# Patient Record
Sex: Female | Born: 1975 | Race: Black or African American | Hispanic: No | Marital: Single | Smoking: Never smoker
Health system: Southern US, Community
[De-identification: ages and names within clinical notes are randomized; demographics above are authoritative.]

## PROBLEM LIST (undated history)

## (undated) DIAGNOSIS — F32A Depression, unspecified: Secondary | ICD-10-CM

## (undated) DIAGNOSIS — L309 Dermatitis, unspecified: Secondary | ICD-10-CM

## (undated) DIAGNOSIS — T7840XA Allergy, unspecified, initial encounter: Secondary | ICD-10-CM

## (undated) DIAGNOSIS — F329 Major depressive disorder, single episode, unspecified: Secondary | ICD-10-CM

## (undated) HISTORY — DX: Dermatitis, unspecified: L30.9

## (undated) HISTORY — DX: Major depressive disorder, single episode, unspecified: F32.9

## (undated) HISTORY — PX: CYST EXCISION: SHX5701

## (undated) HISTORY — DX: Depression, unspecified: F32.A

## (undated) HISTORY — DX: Allergy, unspecified, initial encounter: T78.40XA

---

## 1998-11-04 ENCOUNTER — Ambulatory Visit (HOSPITAL_COMMUNITY): Admission: RE | Admit: 1998-11-04 | Discharge: 1998-11-04 | Payer: Self-pay | Admitting: *Deleted

## 1999-05-13 ENCOUNTER — Other Ambulatory Visit: Admission: RE | Admit: 1999-05-13 | Discharge: 1999-05-13 | Payer: Self-pay | Admitting: *Deleted

## 1999-06-21 ENCOUNTER — Other Ambulatory Visit: Admission: RE | Admit: 1999-06-21 | Discharge: 1999-06-21 | Payer: Self-pay | Admitting: Obstetrics and Gynecology

## 1999-06-21 ENCOUNTER — Encounter (INDEPENDENT_AMBULATORY_CARE_PROVIDER_SITE_OTHER): Payer: Self-pay | Admitting: Specialist

## 1999-10-20 ENCOUNTER — Other Ambulatory Visit: Admission: RE | Admit: 1999-10-20 | Discharge: 1999-10-20 | Payer: Self-pay | Admitting: Obstetrics and Gynecology

## 2000-04-20 ENCOUNTER — Other Ambulatory Visit: Admission: RE | Admit: 2000-04-20 | Discharge: 2000-04-20 | Payer: Self-pay | Admitting: *Deleted

## 2000-10-23 ENCOUNTER — Other Ambulatory Visit: Admission: RE | Admit: 2000-10-23 | Discharge: 2000-10-23 | Payer: Self-pay | Admitting: Obstetrics and Gynecology

## 2001-05-16 ENCOUNTER — Other Ambulatory Visit: Admission: RE | Admit: 2001-05-16 | Discharge: 2001-05-16 | Payer: Self-pay | Admitting: *Deleted

## 2001-05-17 ENCOUNTER — Other Ambulatory Visit: Admission: RE | Admit: 2001-05-17 | Discharge: 2001-05-17 | Payer: Self-pay | Admitting: *Deleted

## 2002-01-03 ENCOUNTER — Other Ambulatory Visit: Admission: RE | Admit: 2002-01-03 | Discharge: 2002-01-03 | Payer: Self-pay | Admitting: Obstetrics and Gynecology

## 2003-11-27 ENCOUNTER — Other Ambulatory Visit: Admission: RE | Admit: 2003-11-27 | Discharge: 2003-11-27 | Payer: Self-pay | Admitting: Family Medicine

## 2005-02-10 ENCOUNTER — Other Ambulatory Visit: Admission: RE | Admit: 2005-02-10 | Discharge: 2005-02-10 | Payer: Self-pay | Admitting: Family Medicine

## 2005-05-04 ENCOUNTER — Ambulatory Visit (HOSPITAL_COMMUNITY): Admission: RE | Admit: 2005-05-04 | Discharge: 2005-05-04 | Payer: Self-pay | Admitting: Orthopedic Surgery

## 2005-05-31 ENCOUNTER — Encounter: Admission: RE | Admit: 2005-05-31 | Discharge: 2005-05-31 | Payer: Self-pay | Admitting: Orthopedic Surgery

## 2005-12-08 ENCOUNTER — Encounter: Admission: RE | Admit: 2005-12-08 | Discharge: 2005-12-08 | Payer: Self-pay | Admitting: Emergency Medicine

## 2006-03-09 ENCOUNTER — Other Ambulatory Visit: Admission: RE | Admit: 2006-03-09 | Discharge: 2006-03-09 | Payer: Self-pay | Admitting: Family Medicine

## 2007-03-12 ENCOUNTER — Other Ambulatory Visit: Admission: RE | Admit: 2007-03-12 | Discharge: 2007-03-12 | Payer: Self-pay | Admitting: Family Medicine

## 2008-03-18 ENCOUNTER — Other Ambulatory Visit: Admission: RE | Admit: 2008-03-18 | Discharge: 2008-03-18 | Payer: Self-pay | Admitting: Family Medicine

## 2013-04-07 ENCOUNTER — Other Ambulatory Visit (HOSPITAL_COMMUNITY)
Admission: RE | Admit: 2013-04-07 | Discharge: 2013-04-07 | Disposition: A | Discharge status: Home / Self Care | Payer: Self-pay | Source: Ambulatory Visit | Attending: Medical | Admitting: Medical

## 2013-04-07 ENCOUNTER — Ambulatory Visit (INDEPENDENT_AMBULATORY_CARE_PROVIDER_SITE_OTHER): Payer: BC Managed Care – PPO | Admitting: Medical

## 2013-04-07 ENCOUNTER — Encounter: Payer: Self-pay | Admitting: Medical

## 2013-04-07 VITALS — BP 110/80 | HR 68 | Temp 98.2°F | Resp 16 | Ht 62.5 in | Wt 164.0 lb

## 2013-04-07 DIAGNOSIS — Z01419 Encounter for gynecological examination (general) (routine) without abnormal findings: Secondary | ICD-10-CM | POA: Insufficient documentation

## 2013-04-07 DIAGNOSIS — Z124 Encounter for screening for malignant neoplasm of cervix: Secondary | ICD-10-CM

## 2013-04-07 DIAGNOSIS — Z113 Encounter for screening for infections with a predominantly sexual mode of transmission: Secondary | ICD-10-CM

## 2013-04-07 DIAGNOSIS — J309 Allergic rhinitis, unspecified: Secondary | ICD-10-CM

## 2013-04-07 DIAGNOSIS — Z0000 Encounter for general adult medical examination without abnormal findings: Secondary | ICD-10-CM

## 2013-04-07 DIAGNOSIS — L309 Dermatitis, unspecified: Secondary | ICD-10-CM

## 2013-04-07 DIAGNOSIS — L509 Urticaria, unspecified: Secondary | ICD-10-CM

## 2013-04-07 DIAGNOSIS — L259 Unspecified contact dermatitis, unspecified cause: Secondary | ICD-10-CM

## 2013-04-07 LAB — POCT URINALYSIS DIPSTICK
Bilirubin, UA: NEGATIVE
Blood, UA: NEGATIVE
Leukocytes, UA: NEGATIVE
Nitrite, UA: NEGATIVE
Protein, UA: NEGATIVE
Spec Grav, UA: 1.01
Urobilinogen, UA: NEGATIVE

## 2013-04-07 NOTE — Patient Instructions (Signed)

## 2013-04-07 NOTE — Addendum Note (Signed)
Addended by: Jac Canavan on: 04/07/2013 11:41 AM   Modules accepted: Orders

## 2013-04-07 NOTE — Progress Notes (Addendum)
Subjective:   HPI  Sheila Nunez is a 37 y.o. female who presents for a complete physical.  New patient today.  Was seeing urgent care prior.  She did bring labs today from 4/14, work screening labs.   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    Preventative care: Last ophthalmology visit:N/A Last dental visit:Dr.redd Last colonoscopy:n/a Last mammogram:n/a Last gynecological exam:maybe 2 years ago Last EKG:n/a Last labs:yes- she has a copy   Prior vaccinations: TD or Tdap:up to date- unsure of the date Influenza:n/a Pneumococcal:n/a Shingles/Zostavax:n/a  Advanced directive:n/a Health care power of attorney:n/a Living will:n/a  Concerns: Last pap 2-3 years ago, normal.  Went to urgent care for this.   Single, has 1 partner x less than a year.   Has had 3 pregnancies, 1 live birth, 1 TAB, 1 miscarriage.  Last STD screening about 2-3 years ago.    Sees Dr. Terri Piedra, dermatology.   They feel like she has some type of seasonal allergy.   She has changed detergent, deodorant, was recently put on stronger steroid cream for eczema, but Dr. Terri Piedra felt like she was having worsening eczema due to environmental allergies.  She uses sunscreen, but does lay out in the sun some in the summer when she goes to the beach.    Sweats more in areas she uses her cream for eczema.  concerns about the darkening where she had used the creams.  At times gets charley horse type pain and tingling in right thigh and foot.  Reviewed their medical, surgical, family, social, medication, and allergy history and updated chart as appropriate.   Past Medical History  Diagnosis Date  . Allergy     Dr. Terri Piedra  . Eczema     Dr. Terri Piedra    Past Surgical History  Procedure Laterality Date  . Cyst excision      eyelids, Dr. Nile Riggs    Family History  Problem Relation Age of Onset  . GER disease Brother   . Heart disease Neg Hx   . Diabetes Neg Hx   . Hypertension Neg Hx   .  Cancer Neg Hx   . Stroke Neg Hx     History   Social History  . Marital Status: Single    Spouse Name: N/A    Number of Children: N/A  . Years of Education: N/A   Occupational History  . Not on file.   Social History Main Topics  . Smoking status: Never Smoker   . Smokeless tobacco: Not on file  . Alcohol Use: 1.2 oz/week    2 Glasses of wine per week  . Drug Use: No  . Sexually Active: Not on file   Other Topics Concern  . Not on file   Social History Narrative   Single, has one child age 10yo, works at [employer], Counselling psychologist.  Exercise - sees trainer 2 x/wk, walks her dog daily, runs, walks    No current outpatient prescriptions on file prior to visit.   No current facility-administered medications on file prior to visit.    Allergies  Allergen Reactions  . Zithromax (Azithromycin) Hives    Review of Systems Constitutional: -fever, -chills, +sweats, +unexpected weight change, -decreased appetite, -fatigue Allergy: -sneezing, -itching, -congestion Dermatology: -changing moles, --rash, -lumps ENT: -runny nose, -ear pain, -sore throat, -hoarseness, -sinus pain, -teeth pain, - ringing in ears, -hearing loss, -nosebleeds Cardiology: -chest pain, -palpitations, -swelling, -difficulty breathing when lying flat, -waking up short of  breath Respiratory: -cough, -shortness of breath, -difficulty breathing with exercise or exertion, -wheezing, -coughing up blood Gastroenterology: -abdominal pain, -nausea, -vomiting, -diarrhea, -constipation, -blood in stool, -changes in bowel movement, -difficulty swallowing or eating Hematology: -bleeding, -bruising  Musculoskeletal: -joint aches, -muscle aches, -joint swelling, +back pain, -neck pain, -cramping, -changes in gait Ophthalmology: denies vision changes, eye redness, itching, discharge Urology: -burning with urination, -difficulty urinating, -blood in urine, -urinary frequency, -urgency, -incontinence Neurology:  -headache, -weakness, +tingling, +numbness, -memory loss, -falls, -dizziness Psychology: -depressed mood, -agitation, -sleep problems     Objective:   Physical Exam  Filed Vitals:   04/07/13 1032  BP: 110/80  Pulse: 68  Temp: 98.2 F (36.8 C)    General appearance: alert, no distress, WD/WN Skin: no worrisome lesions, few scattered macules, bilat dorsal hands with darker brown pigmentation, possibly related to recent steroid topical use HEENT: normocephalic, conjunctiva/corneas normal, sclerae anicteric, PERRLA, EOMi, nares patent, no discharge or erythema, pharynx normal Oral cavity: MMM, tongue normal, teeth in good repair Neck: supple, no lymphadenopathy, no thyromegaly, no masses, normal ROM, no bruits Chest: non tender, normal shape and expansion Heart: RRR, normal S1, S2, no murmurs Lungs: CTA bilaterally, no wheezes, rhonchi, or rales Abdomen: +bs, soft, non tender, non distended, no masses, no hepatomegaly, no splenomegaly, no bruits Back: non tender, normal ROM, no scoliosis Musculoskeletal: upper extremities non tender, no obvious deformity, normal ROM throughout, lower extremities non tender, no obvious deformity, normal ROM throughout Extremities: no edema, no cyanosis, no clubbing Pulses: 2+ symmetric, upper and lower extremities, normal cap refill Neurological: alert, oriented x 3, CN2-12 intact, strength normal upper extremities and lower extremities, sensation normal throughout, DTRs 2+ throughout, no cerebellar signs, gait normal Psychiatric: normal affect, behavior normal, pleasant  Breast: nontender, no masses or lumps, no skin changes, no nipple discharge or inversion, no axillary lymphadenopathy Gyn: Normal external genitalia without lesions, vagina with normal mucosa, cervix without lesions, no cervical motion tenderness, no abnormal vaginal discharge.  Uterus and adnexa not enlarged, nontender, no masses.  Pap performed.  Exam chaperoned by nurse. Rectal:  deferred   Assessment and Plan :    Encounter Diagnoses  Name Primary?  . Routine general medical examination at a health care facility Yes  . Allergic rhinitis   . Eczema   . Hives   . Screening for STD (sexually transmitted disease)   . Screening for cervical cancer    Physical exam - discussed healthy lifestyle, diet, exercise, preventative care, vaccinations, and addressed their concerns.  Handout given.  Allergic rhinitis - lab panel today given the hives, worse skin issues  Eczema- cautioned with prolonged of frequent use of skin topical steroids, but f/u with dermatology  STD screening and pap today, discussed safe sex  Follow-up pending labs

## 2013-04-08 ENCOUNTER — Encounter: Payer: Self-pay | Admitting: Medical

## 2013-04-08 LAB — ALLERGY FULL PROFILE
Alternaria Alternata: <0.1 kU/L
Box Elder IgE: <0.1 kU/L
Candida Albicans: <0.1 kU/L
Cat Dander: <0.1 kU/L
Common Ragweed: <0.1 kU/L
Curvularia lunata: <0.1 kU/L
D. farinae: <0.1 kU/L
Dog Dander: <0.1 kU/L
Elm IgE: <0.1 kU/L
Helminthosporium halodes: <0.1 kU/L
Lamb's Quarters: <0.1 kU/L
Oak: <0.1 kU/L
Stemphylium Botryosum: <0.1 kU/L

## 2013-04-08 LAB — ALLERGEN FOOD PROFILE SPECIFIC IGE
Chicken IgE: <0.1 kU/L
IgE (Immunoglobulin E), Serum: 6.2 IU/mL (ref 0.0–180.0)
Orange: <0.1 kU/L
Shrimp IgE: <0.1 kU/L
Tomato IgE: <0.1 kU/L

## 2013-04-09 ENCOUNTER — Encounter: Payer: Self-pay | Admitting: Family Medicine

## 2013-04-28 ENCOUNTER — Telehealth: Payer: Self-pay | Admitting: Family Medicine

## 2013-04-28 NOTE — Telephone Encounter (Signed)
Pt called and left message on my voice mail.  I called her back and she wanted phone number for allergist.

## 2013-05-05 ENCOUNTER — Other Ambulatory Visit: Payer: Self-pay | Admitting: Surgery

## 2014-02-03 ENCOUNTER — Encounter: Payer: Self-pay | Admitting: Family

## 2014-02-03 ENCOUNTER — Ambulatory Visit (INDEPENDENT_AMBULATORY_CARE_PROVIDER_SITE_OTHER): Payer: BC Managed Care – PPO | Admitting: Family

## 2014-02-03 VITALS — BP 98/64 | HR 61 | Ht 62.25 in | Wt 173.0 lb

## 2014-02-03 DIAGNOSIS — F32A Depression, unspecified: Secondary | ICD-10-CM

## 2014-02-03 DIAGNOSIS — F3289 Other specified depressive episodes: Secondary | ICD-10-CM

## 2014-02-03 DIAGNOSIS — R6889 Other general symptoms and signs: Secondary | ICD-10-CM

## 2014-02-03 DIAGNOSIS — J309 Allergic rhinitis, unspecified: Secondary | ICD-10-CM

## 2014-02-03 DIAGNOSIS — F329 Major depressive disorder, single episode, unspecified: Secondary | ICD-10-CM

## 2014-02-03 LAB — CBC WITH DIFFERENTIAL/PLATELET
BASOS ABS: 0 10*3/uL (ref 0.0–0.1)
BASOS PCT: 0.4 % (ref 0.0–3.0)
EOS PCT: 0.6 % (ref 0.0–5.0)
Eosinophils Absolute: 0 10*3/uL (ref 0.0–0.7)
HEMATOCRIT: 44.7 % (ref 36.0–46.0)
HEMOGLOBIN: 14.9 g/dL (ref 12.0–15.0)
LYMPHS PCT: 34.9 % (ref 12.0–46.0)
Lymphs Abs: 1.5 10*3/uL (ref 0.7–4.0)
MCHC: 33.3 g/dL (ref 30.0–36.0)
MCV: 87.9 fl (ref 78.0–100.0)
MONOS PCT: 7.2 % (ref 3.0–12.0)
Monocytes Absolute: 0.3 10*3/uL (ref 0.1–1.0)
NEUTROS ABS: 2.5 10*3/uL (ref 1.4–7.7)
NEUTROS PCT: 56.9 % (ref 43.0–77.0)
PLATELETS: 159 10*3/uL (ref 150.0–400.0)
RBC: 5.08 Mil/uL (ref 3.87–5.11)
RDW: 12.3 % (ref 11.5–14.6)
WBC: 4.4 10*3/uL — ABNORMAL LOW (ref 4.5–10.5)

## 2014-02-03 LAB — TSH: TSH: 1.04 u[IU]/mL (ref 0.35–5.50)

## 2014-02-03 NOTE — Progress Notes (Signed)
Pre visit review using our clinic review tool, if applicable. No additional management support is needed unless otherwise documented below in the visit note. 

## 2014-02-03 NOTE — Patient Instructions (Signed)

## 2014-02-03 NOTE — Progress Notes (Signed)
Subjective:    Patient ID: Sheila Nunez, female    DOB: 01-26-1976, 38 y.o.   MRN: [id number]  HPI  38 year old American female, new patient to the practice and to be established. Reports concerns allergic reaction and again in 2013 because the skin on her legs to become white and flaking. By May, her skin began to turn red she had allergy testing performed and was revealed that she had an allergy to gold, nasal, intact. He is currently stable and taken Allegra daily.  Has concerns today of weight gain, he intolerance over the last one month. Reports feeling down and depressed at times. She has a bad relationship with her mother who just moved out of her home 2 weeks ago. Has some financial struggles.  No helplessness or hopelessness, no thoughts of death or dying.   Review of Systems  Constitutional: Negative.   HENT: Negative.   Respiratory: Negative.   Cardiovascular: Negative.   Gastrointestinal: Negative.   Endocrine: Positive for heat intolerance.  Musculoskeletal: Negative.   Skin: Negative.   Allergic/Immunologic: Positive for environmental allergies.  Neurological: Negative.   Hematological: Negative.   Psychiatric/Behavioral: Negative.    Past Medical History  Diagnosis Date  . Allergy     Dr. Allyson Sabal  . Eczema     Dr. Allyson Sabal    History   Social History  . Marital Status: Single    Spouse Name: N/A    Number of Children: N/A  . Years of Education: N/A   Occupational History  . Not on file.   Social History Main Topics  . Smoking status: Never Smoker   . Smokeless tobacco: Not on file  . Alcohol Use: 1.2 oz/week    2 Glasses of wine per week  . Drug Use: No  . Sexual Activity: Not on file   Other Topics Concern  . Not on file   Social History Narrative   Single, has one child age 53yo, works at [employer], Dance movement psychotherapist.  Exercise - sees trainer 2 x/wk, walks her dog daily, runs, walks    Past Surgical History  Procedure Laterality Date  .  Cyst excision      eyelids, Dr. Gershon Crane    Family History  Problem Relation Age of Onset  . GER disease Brother   . Heart disease Neg Hx   . Diabetes Neg Hx   . Hypertension Neg Hx   . Cancer Neg Hx   . Stroke Neg Hx     Allergies  Allergen Reactions  . Zithromax [Azithromycin] Hives    Current Outpatient Prescriptions on File Prior to Visit  Medication Sig Dispense Refill  . fexofenadine (ALLEGRA) 30 MG tablet Take 30 mg by mouth 2 (two) times daily.      . cetirizine (ZYRTEC) 10 MG tablet Take 10 mg by mouth daily.       No current facility-administered medications on file prior to visit.    BP 98/64  Pulse 61  Ht 5' 2.25" (1.581 m)  Wt 173 lb (78.472 kg)  BMI 31.39 kg/m2chart    Objective:   Physical Exam  Constitutional: She appears well-developed and well-nourished.  HENT:  Right Ear: External ear normal.  Left Ear: External ear normal.  Nose: Nose normal.  Mouth/Throat: Oropharynx is clear and moist.  Neck: Normal range of motion. Neck supple.  Cardiovascular: Normal rate, regular rhythm and normal heart sounds.   Pulmonary/Chest: Effort normal and breath sounds normal.  Abdominal: Soft. Bowel sounds are normal.  Neurological: She is alert.  Skin: Skin is warm and dry.  Psychiatric: She has a normal mood and affect.          Assessment & Plan:  Sheila Nunez was seen today for establish care.  Diagnoses and associated orders for this visit:  Allergic rhinitis - TSH - CBC with Differential - ANA  Depression - TSH - CBC with Differential - ANA  Heat intolerance - TSH - CBC with Differential - ANA    consider starting an SSRI if her labs are normal.

## 2014-02-04 LAB — ANA: ANA: NEGATIVE

## 2014-02-10 ENCOUNTER — Encounter: Payer: Self-pay | Admitting: Family

## 2014-02-16 ENCOUNTER — Other Ambulatory Visit: Payer: Self-pay | Admitting: Family

## 2014-02-16 MED ORDER — BUPROPION HCL ER (XL) 150 MG PO TB24
150.0000 mg | ORAL_TABLET | Freq: Every day | ORAL | Status: DC
Start: 2014-02-16 — End: 2014-03-18

## 2014-03-18 ENCOUNTER — Telehealth: Payer: Self-pay | Admitting: Family

## 2014-03-18 MED ORDER — BUPROPION HCL ER (XL) 150 MG PO TB24: 150.0000 mg | ORAL_TABLET | Freq: Every day | ORAL | Status: DC

## 2014-03-18 NOTE — Telephone Encounter (Signed)
Done

## 2014-03-18 NOTE — Telephone Encounter (Signed)
[id number] - [address] requesting 90 re-fill on buPROPion (WELLBUTRIN XL) 150 MG 24 hr tablet

## 2014-04-03 ENCOUNTER — Encounter: Payer: Self-pay | Admitting: Family

## 2014-04-09 MED ORDER — BUPROPION HCL ER (XL) 150 MG PO TB24: 150.0000 mg | ORAL_TABLET | Freq: Every day | ORAL | Status: DC

## 2014-04-09 NOTE — Addendum Note (Signed)
Addended by: Miles Costain T on: 04/09/2014 11:16 AM   Modules accepted: Orders

## 2014-04-09 NOTE — Telephone Encounter (Signed)
Sent in one 90 day supply as this looks like a trial given by [place].

## 2014-04-09 NOTE — Telephone Encounter (Signed)
[facility] [address] is requesting 90 day re-fill on   buPROPion (WELLBUTRIN XL) 150 MG 24 hr tablet

## 2014-04-15 ENCOUNTER — Ambulatory Visit (INDEPENDENT_AMBULATORY_CARE_PROVIDER_SITE_OTHER): Payer: BC Managed Care – PPO | Admitting: Family

## 2014-04-15 ENCOUNTER — Encounter: Payer: Self-pay | Admitting: Family

## 2014-04-15 VITALS — BP 104/74 | HR 87 | Temp 98.4°F | Wt 177.0 lb

## 2014-04-15 DIAGNOSIS — F329 Major depressive disorder, single episode, unspecified: Secondary | ICD-10-CM

## 2014-04-15 DIAGNOSIS — F32A Depression, unspecified: Secondary | ICD-10-CM

## 2014-04-15 DIAGNOSIS — R141 Gas pain: Secondary | ICD-10-CM

## 2014-04-15 DIAGNOSIS — R142 Eructation: Secondary | ICD-10-CM

## 2014-04-15 DIAGNOSIS — R143 Flatulence: Secondary | ICD-10-CM

## 2014-04-15 DIAGNOSIS — R140 Abdominal distension (gaseous): Secondary | ICD-10-CM

## 2014-04-15 DIAGNOSIS — F3289 Other specified depressive episodes: Secondary | ICD-10-CM

## 2014-04-15 MED ORDER — LEVOCETIRIZINE DIHYDROCHLORIDE 5 MG PO TABS: 5.0000 mg | ORAL_TABLET | Freq: Every evening | ORAL | Status: DC

## 2014-04-15 NOTE — Patient Instructions (Addendum)
1. Richardo Priest, Therapist 2. Consider Lexapro  Depression, Adult Depression refers to feeling sad, low, down in the dumps, blue, gloomy, or empty. In general, there are two kinds of depression: 1. Depression that we all experience from time to time because of upsetting life experiences, including the loss of a job or the ending of a relationship (normal sadness or normal grief). This kind of depression is considered normal, is short lived, and resolves within a few days to 2 weeks. (Depression experienced after the loss of a loved one is called bereavement. Bereavement often lasts longer than 2 weeks but normally gets better with time.) 2. Clinical depression, which lasts longer than normal sadness or normal grief or interferes with your ability to function at home, at work, and in school. It also interferes with your personal relationships. It affects almost every aspect of your life. Clinical depression is an illness. Symptoms of depression also can be caused by conditions other than normal sadness and grief or clinical depression. Examples of these conditions are listed as follows:  Physical illness Some physical illnesses, including underactive thyroid gland (hypothyroidism), severe anemia, specific types of cancer, diabetes, uncontrolled seizures, heart and lung problems, strokes, and chronic pain are commonly associated with symptoms of depression.  Side effects of some prescription medicine In some people, certain types of prescription medicine can cause symptoms of depression.  Substance abuse Abuse of alcohol and illicit drugs can cause symptoms of depression. SYMPTOMS Symptoms of normal sadness and normal grief include the following:  Feeling sad or crying for short periods of time.  Not caring about anything (apathy).  Difficulty sleeping or sleeping too much.  No longer able to enjoy the things you used to enjoy.  Desire to be by oneself all the time (social isolation).  Lack of  energy or motivation.  Difficulty concentrating or remembering.  Change in appetite or weight.  Restlessness or agitation. Symptoms of clinical depression include the same symptoms of normal sadness or normal grief and also the following symptoms:  Feeling sad or crying all the time.  Feelings of guilt or worthlessness.  Feelings of hopelessness or helplessness.  Thoughts of suicide or the desire to harm yourself (suicidal ideation).  Loss of touch with reality (psychotic symptoms). Seeing or hearing things that are not real (hallucinations) or having false beliefs about your life or the people around you (delusions and paranoia). DIAGNOSIS  The diagnosis of clinical depression usually is based on the severity and duration of the symptoms. Your caregiver also will ask you questions about your medical history and substance use to find out if physical illness, use of prescription medicine, or substance abuse is causing your depression. Your caregiver also may order blood tests. TREATMENT  Typically, normal sadness and normal grief do not require treatment. However, sometimes antidepressant medicine is prescribed for bereavement to ease the depressive symptoms until they resolve. The treatment for clinical depression depends on the severity of your symptoms but typically includes antidepressant medicine, counseling with a mental health professional, or a combination of both. Your caregiver will help to determine what treatment is best for you. Depression caused by physical illness usually goes away with appropriate medical treatment of the illness. If prescription medicine is causing depression, talk with your caregiver about stopping the medicine, decreasing the dose, or substituting another medicine. Depression caused by abuse of alcohol or illicit drugs abuse goes away with abstinence from these substances. Some adults need professional help in order to stop drinking or using  drugs. SEEK  IMMEDIATE CARE IF:  You have thoughts about hurting yourself or others.  You lose touch with reality (have psychotic symptoms).  You are taking medicine for depression and have a serious side effect. FOR MORE INFORMATION National Alliance on Mental Illness: www.nami.[employer] of Mental [employer]: https://carter.com/ Document Released: 11/03/2000 Document Revised: 05/07/2012 Document Reviewed: 02/05/2012 [facility] Patient Information 2014 [place].

## 2014-04-15 NOTE — Progress Notes (Signed)
Pre visit review using our clinic review tool, if applicable. No additional management support is needed unless otherwise documented below in the visit note. 

## 2014-04-16 NOTE — Progress Notes (Signed)
   Subjective:    Patient ID: Sheila Nunez, female    DOB: Sep 13, 1976, 38 y.o.   MRN: [id number]  HPI 38 year old AAf, nonsmoker, is in today for a recheck of Depression. She is currently on Wellbutrin but feels it may be causing bloating. She is also unsure if it was working. She has approx 5 days a month where she does not feel like doing anything and want to stay in bed. Has never taken medication before. No helplessness, hopelessness, thoughts of death or dying.    Review of Systems  Constitutional: Negative.   Respiratory: Negative.   Cardiovascular: Negative.   Gastrointestinal: Negative.   Endocrine: Negative.   Musculoskeletal: Negative.   Skin: Negative.   Psychiatric/Behavioral: Positive for sleep disturbance. The patient is nervous/anxious.    Past Medical History  Diagnosis Date  . Allergy     Dr. Allyson Sabal  . Eczema     Dr. Allyson Sabal    History   Social History  . Marital Status: Single    Spouse Name: N/A    Number of Children: N/A  . Years of Education: N/A   Occupational History  . Not on file.   Social History Main Topics  . Smoking status: Never Smoker   . Smokeless tobacco: Not on file  . Alcohol Use: 1.2 oz/week    2 Glasses of wine per week  . Drug Use: No  . Sexual Activity: Not on file   Other Topics Concern  . Not on file   Social History Narrative   Single, has one child age 73yo, works at [employer], Dance movement psychotherapist.  Exercise - sees trainer 2 x/wk, walks her dog daily, runs, walks    Past Surgical History  Procedure Laterality Date  . Cyst excision      eyelids, Dr. Gershon Crane    Family History  Problem Relation Age of Onset  . GER disease Brother   . Heart disease Neg Hx   . Diabetes Neg Hx   . Hypertension Neg Hx   . Cancer Neg Hx   . Stroke Neg Hx     Allergies  Allergen Reactions  . Zithromax [Azithromycin] Hives    No current outpatient prescriptions on file prior to visit.   No current facility-administered  medications on file prior to visit.    BP 104/74  Pulse 87  Temp(Src) 98.4 F (36.9 C) (Oral)  Wt 177 lb (80.287 kg)  SpO2 99%chart     Objective:   Physical Exam  Constitutional: She is oriented to person, place, and time. She appears well-developed and well-nourished.  Neck: Normal range of motion. Neck supple.  Cardiovascular: Normal rate, regular rhythm and normal heart sounds.   Pulmonary/Chest: Effort normal and breath sounds normal.  Musculoskeletal: Normal range of motion.  Neurological: She is alert and oriented to person, place, and time.  Skin: Skin is warm and dry.  Psychiatric: She has a normal mood and affect.          Assessment & Plan:  Averee was seen today for medication problem.  Diagnoses and associated orders for this visit:  Depression  Bloating symptom  Other Orders - levocetirizine (XYZAL) 5 MG tablet; Take 1 tablet (5 mg total) by mouth every evening.   Consider Lexapro after being off Wellbutrin x 1 week. I do not think it is causing bloating.

## 2014-04-23 ENCOUNTER — Encounter: Payer: Self-pay | Admitting: Family

## 2014-04-28 ENCOUNTER — Ambulatory Visit (INDEPENDENT_AMBULATORY_CARE_PROVIDER_SITE_OTHER): Payer: BC Managed Care – PPO | Admitting: Licensed Clinical Social Worker

## 2014-04-28 DIAGNOSIS — F331 Major depressive disorder, recurrent, moderate: Secondary | ICD-10-CM

## 2014-05-05 ENCOUNTER — Ambulatory Visit (INDEPENDENT_AMBULATORY_CARE_PROVIDER_SITE_OTHER): Payer: BC Managed Care – PPO | Admitting: Family

## 2014-05-05 ENCOUNTER — Other Ambulatory Visit: Payer: Self-pay

## 2014-05-05 ENCOUNTER — Encounter: Payer: Self-pay | Admitting: Family

## 2014-05-05 VITALS — BP 108/80 | HR 64 | Wt 177.0 lb

## 2014-05-05 DIAGNOSIS — F329 Major depressive disorder, single episode, unspecified: Secondary | ICD-10-CM

## 2014-05-05 DIAGNOSIS — F411 Generalized anxiety disorder: Secondary | ICD-10-CM

## 2014-05-05 DIAGNOSIS — F3289 Other specified depressive episodes: Secondary | ICD-10-CM

## 2014-05-05 MED ORDER — ESCITALOPRAM OXALATE 10 MG PO TABS: 10.0000 mg | ORAL_TABLET | Freq: Every day | ORAL | Status: DC

## 2014-05-05 NOTE — Patient Instructions (Signed)

## 2014-05-05 NOTE — Progress Notes (Signed)
Subjective:    Patient ID: Sheila Nunez, female    DOB: Nov 07, 1976, 38 y.o.   MRN: [id number]  HPI 38 year old [name], nonsmoker is in today for recheck of anxiety and depression. At her last office visit we discontinued Wellbutrin due to her concerns of feeling bloated on the medication. Reports that the bloating has subsided. She saw the therapist, somebody who suggested that she be on a medication to help with anxiety and depression. She denies any feelings of helplessness, hopelessness, thoughts of death or dying. However, reports spending a lot of time alone.   Review of Systems  Constitutional: Negative.   Respiratory: Negative.   Cardiovascular: Negative.   Gastrointestinal: Negative.   Genitourinary: Negative.   Musculoskeletal: Negative.   Skin: Negative.   Psychiatric/Behavioral: Positive for agitation. The patient is nervous/anxious.    Past Medical History  Diagnosis Date  . Allergy     Dr. Allyson Sabal  . Eczema     Dr. Allyson Sabal    History   Social History  . Marital Status: Single    Spouse Name: N/A    Number of Children: N/A  . Years of Education: N/A   Occupational History  . Not on file.   Social History Main Topics  . Smoking status: Never Smoker   . Smokeless tobacco: Not on file  . Alcohol Use: 1.2 oz/week    2 Glasses of wine per week  . Drug Use: No  . Sexual Activity: Not on file   Other Topics Concern  . Not on file   Social History Narrative   Single, has one child age 87yo, works at [employer], Dance movement psychotherapist.  Exercise - sees trainer 2 x/wk, walks her dog daily, runs, walks    Past Surgical History  Procedure Laterality Date  . Cyst excision      eyelids, Dr. Gershon Crane    Family History  Problem Relation Age of Onset  . GER disease Brother   . Heart disease Neg Hx   . Diabetes Neg Hx   . Hypertension Neg Hx   . Cancer Neg Hx   . Stroke Neg Hx     Allergies  Allergen Reactions  . Zithromax [Azithromycin]  Hives    Current Outpatient Prescriptions on File Prior to Visit  Medication Sig Dispense Refill  . levocetirizine (XYZAL) 5 MG tablet Take 1 tablet (5 mg total) by mouth every evening.  30 tablet  0   No current facility-administered medications on file prior to visit.    BP 108/80  Pulse 64  Wt 177 lb (80.287 kg)chart    Objective:   Physical Exam  Constitutional: She is oriented to person, place, and time. She appears well-developed and well-nourished.  HENT:  Right Ear: External ear normal.  Left Ear: External ear normal.  Nose: Nose normal.  Mouth/Throat: Oropharynx is clear and moist.  Neck: Normal range of motion. Neck supple.  Cardiovascular: Normal rate, regular rhythm and normal heart sounds.   Pulmonary/Chest: Effort normal and breath sounds normal.  Musculoskeletal: Normal range of motion.  Neurological: She is alert and oriented to person, place, and time.  Skin: Skin is warm and dry.  Psychiatric: She has a normal mood and affect.          Assessment & Plan:   Problem List Items Addressed This Visit   None    Visit Diagnoses   Depressive disorder, not elsewhere classified    -  Primary    Relevant Medications  escitalopram (LEXAPRO) tablet    Generalized anxiety disorder         Call the office with any questions or concerns. Recheck in 3 weeks and sooner as needed.

## 2014-05-06 ENCOUNTER — Ambulatory Visit (INDEPENDENT_AMBULATORY_CARE_PROVIDER_SITE_OTHER): Payer: BC Managed Care – PPO | Admitting: Licensed Clinical Social Worker

## 2014-05-06 DIAGNOSIS — F331 Major depressive disorder, recurrent, moderate: Secondary | ICD-10-CM

## 2014-05-19 ENCOUNTER — Ambulatory Visit (INDEPENDENT_AMBULATORY_CARE_PROVIDER_SITE_OTHER): Payer: BC Managed Care – PPO | Admitting: Licensed Clinical Social Worker

## 2014-05-19 DIAGNOSIS — F331 Major depressive disorder, recurrent, moderate: Secondary | ICD-10-CM

## 2014-05-26 ENCOUNTER — Ambulatory Visit (INDEPENDENT_AMBULATORY_CARE_PROVIDER_SITE_OTHER): Payer: BC Managed Care – PPO | Admitting: Family

## 2014-05-26 ENCOUNTER — Encounter: Payer: Self-pay | Admitting: Family

## 2014-05-26 VITALS — BP 100/80 | HR 87 | Wt 178.0 lb

## 2014-05-26 DIAGNOSIS — F329 Major depressive disorder, single episode, unspecified: Secondary | ICD-10-CM

## 2014-05-26 DIAGNOSIS — F3289 Other specified depressive episodes: Secondary | ICD-10-CM

## 2014-05-26 NOTE — Progress Notes (Signed)
   Subjective:    Patient ID: Sheila Nunez, female    DOB: 08/31/1976, 38 y.o.   MRN: [id number]  HPI  38 year old [place] female, nonsmoker is in today for recheck of depression. Is currently on Lexapro 10 mg once daily and tolerating it well. Denies any concerns. She has began exercising and following a healthy diet in hopes of reducing her weight.  Review of Systems  Constitutional: Negative.   Respiratory: Negative.   Cardiovascular: Negative.   Skin: Negative.   Allergic/Immunologic: Negative.   Psychiatric/Behavioral: Negative.    Past Medical History  Diagnosis Date  . Allergy     Dr. Allyson Sabal  . Eczema     Dr. Allyson Sabal    History   Social History  . Marital Status: Single    Spouse Name: N/A    Number of Children: N/A  . Years of Education: N/A   Occupational History  . Not on file.   Social History Main Topics  . Smoking status: Never Smoker   . Smokeless tobacco: Not on file  . Alcohol Use: 1.2 oz/week    2 Glasses of wine per week  . Drug Use: No  . Sexual Activity: Not on file   Other Topics Concern  . Not on file   Social History Narrative   Single, has one child age 73yo, works at [employer], Dance movement psychotherapist.  Exercise - sees trainer 2 x/wk, walks her dog daily, runs, walks    Past Surgical History  Procedure Laterality Date  . Cyst excision      eyelids, Dr. Gershon Crane    Family History  Problem Relation Age of Onset  . GER disease Brother   . Heart disease Neg Hx   . Diabetes Neg Hx   . Hypertension Neg Hx   . Cancer Neg Hx   . Stroke Neg Hx     Allergies  Allergen Reactions  . Zithromax [Azithromycin] Hives    Current Outpatient Prescriptions on File Prior to Visit  Medication Sig Dispense Refill  . escitalopram (LEXAPRO) 10 MG tablet Take 1 tablet (10 mg total) by mouth daily.  30 tablet  1  . levocetirizine (XYZAL) 5 MG tablet Take 1 tablet (5 mg total) by mouth every evening.  30 tablet  0   No current  facility-administered medications on file prior to visit.    BP 100/80  Pulse 87  Wt 178 lb (80.74 kg)chart     Objective:   Physical Exam  Constitutional: She is oriented to person, place, and time. She appears well-developed and well-nourished.  Neck: Normal range of motion. Neck supple.  Cardiovascular: Normal rate, regular rhythm and normal heart sounds.   Pulmonary/Chest: Effort normal and breath sounds normal.  Neurological: She is alert and oriented to person, place, and time.  Skin: Skin is warm and dry.  Psychiatric: She has a normal mood and affect.          Assessment & Plan:  Melika was seen today for follow-up.  Diagnoses and associated orders for this visit:  Depressive disorder, not elsewhere classified    Continue current medication. Call the office with any questions or concerns recheck as scheduled and as needed.

## 2014-05-26 NOTE — Patient Instructions (Signed)
Exercise to Stay Healthy Exercise helps you become and stay healthy. EXERCISE IDEAS AND TIPS Choose exercises that:  You enjoy.  Fit into your day. You do not need to exercise really hard to be healthy. You can do exercises at a slow or medium level and stay healthy. You can:  Stretch before and after working out.  Try yoga, Pilates, or tai chi.  Lift weights.  Walk fast, swim, jog, run, climb stairs, bicycle, dance, or rollerskate.  Take aerobic classes. Exercises that burn about 150 calories:  Running 1  miles in 15 minutes.  Playing volleyball for 45 to 60 minutes.  Washing and waxing a car for 45 to 60 minutes.  Playing touch football for 45 minutes.  Walking 1  miles in 35 minutes.  Pushing a stroller 1  miles in 30 minutes.  Playing basketball for 30 minutes.  Raking leaves for 30 minutes.  Bicycling 5 miles in 30 minutes.  Walking 2 miles in 30 minutes.  Dancing for 30 minutes.  Shoveling snow for 15 minutes.  Swimming laps for 20 minutes.  Walking up stairs for 15 minutes.  Bicycling 4 miles in 15 minutes.  Gardening for 30 to 45 minutes.  Jumping rope for 15 minutes.  Washing windows or floors for 45 to 60 minutes. Document Released: 12/09/2010 Document Revised: 01/29/2012 Document Reviewed: 12/09/2010 [employer] Patient Information 2015 [employer]. This information is not intended to replace advice given to you by your health care provider. Make sure you discuss any questions you have with your health care provider.  

## 2014-05-26 NOTE — Progress Notes (Signed)
Pre visit review using our clinic review tool, if applicable. No additional management support is needed unless otherwise documented below in the visit note. 

## 2014-06-22 ENCOUNTER — Telehealth: Payer: Self-pay | Admitting: Family

## 2014-06-22 NOTE — Telephone Encounter (Signed)
Patient Information:  Caller Name: Lorenso Courier  Phone: [phone]  Patient: Sheila Nunez, Sheila Nunez  Gender: Female  DOB: [birth year]  Age: 38 Years  PCP: Roxy Cedar [facility])  Pregnant: No  Office Follow Up:  Does the office need to follow up with this patient?: No  Instructions For The Office: N/A  RN Note:  In addition to general allergy symptom relief also advised for hoarse voice/irritated throat the follwing--warm/hot fluids, salt water gargles, throat lozengers, resting voice by not using voice, adequate hydration.  Symptoms  Reason For Call & Symptoms: Hoarse Voice to point of almost a complete loss of voice which patient attributes to her allergies. Daughter speaks on phone for patient as needed during this call.  Reviewed Health History In EMR: Yes  Reviewed Medications In EMR: Yes  Reviewed Allergies In EMR: Yes  Reviewed Surgeries / Procedures: Yes  Date of Onset of Symptoms: 06/20/2014  Treatments Tried: Hot Tea, Cough Drops, 24 hour relief antihistamine  Treatments Tried Worked: No OB / GYN:  LMP: 06/19/2014  Guideline(s) Used:  Hay Fever - Nasal Allergies  Disposition Per Guideline:   Home Care  Reason For Disposition Reached:   Nasal allergies occur only certain times of year  Advice Given:  Wash off Pollen Daily:  Remove pollen from the body with hair washing and a shower, especially before bedtime.  Avoiding Pollen:  Keep windows closed in home, at least in bedroom; use air conditioner  Use a high efficiency house air filter (HEPA or electrostatic)  Keep windows closed in car, turn AC on recirculate  Avoid playing with outdoor dog  For a Stuffy Nose - Use Nasal Washes:  Introduction: Saline (salt water) nasal irrigation (nasal washes) is an effective and simple home remedy for treating stuffy nose and sinus congestion. The nose can be irrigated by pouring, spraying, or squirting salt water into the nose and then letting it run back out.  How it  Helps: The salt water rinses out excess mucus, washes out any irritants (dust, allergens) that might be present, and moistens the nasal cavity.  Methods: There are several ways to perform nasal irrigation. You can use a saline nasal spray bottle (available over-the-counter), a rubber ear syringe, a medical syringe without the needle, or a Neti Pot.  Antihistamine Medications for Hay Fever:  Antihistamines help reduce sneezing, itching, and runny nose.  For Eye Allergies:   For eye symptoms, wash pollen off the face and eyelids. Then apply cold wet compresses. Oral antihistamines will usually bring all eye symptoms under control.  Call Back If:  Symptoms are not controlled in 2 days with continuous antihistamines  You become worse  Patient Will Follow Care Advice:  YES

## 2014-06-24 ENCOUNTER — Encounter: Payer: Self-pay | Admitting: Family

## 2014-06-24 ENCOUNTER — Telehealth: Payer: Self-pay | Admitting: Family

## 2014-06-24 NOTE — Telephone Encounter (Signed)
Pt advised to make appt if voice has not returned. Pt needs appt, but padonda has none this week. Pt refused to make appt w/ another provider and would like to come in Thursday. pls advise.

## 2014-06-24 NOTE — Telephone Encounter (Signed)
Pt states she feels fine, just has no voice.  Will wait until the morning to see how she feels and will cb if she needs to.

## 2014-06-24 NOTE — Telephone Encounter (Signed)
We have coumadin clinic on Thursday. Advise pt that she can either schedule with another provider, go to Urgent Care or wait until we have an available appointment

## 2014-06-25 NOTE — Telephone Encounter (Signed)
Noted  

## 2014-06-28 ENCOUNTER — Other Ambulatory Visit: Payer: Self-pay | Admitting: Family

## 2014-07-01 ENCOUNTER — Ambulatory Visit (INDEPENDENT_AMBULATORY_CARE_PROVIDER_SITE_OTHER): Payer: BC Managed Care – PPO | Admitting: Family

## 2014-07-01 ENCOUNTER — Encounter: Payer: Self-pay | Admitting: Family

## 2014-07-01 VITALS — BP 102/76 | HR 75 | Wt 181.0 lb

## 2014-07-01 DIAGNOSIS — H65199 Other acute nonsuppurative otitis media, unspecified ear: Secondary | ICD-10-CM

## 2014-07-01 DIAGNOSIS — J309 Allergic rhinitis, unspecified: Secondary | ICD-10-CM

## 2014-07-01 DIAGNOSIS — H65195 Other acute nonsuppurative otitis media, recurrent, left ear: Secondary | ICD-10-CM

## 2014-07-01 MED ORDER — FLUTICASONE PROPIONATE 50 MCG/ACT NA SUSP: 2.0000 | Freq: Every day | NASAL | Status: DC

## 2014-07-01 MED ORDER — AMOXICILLIN 500 MG PO TABS: 1000.0000 mg | ORAL_TABLET | Freq: Two times a day (BID) | ORAL | Status: AC

## 2014-07-01 NOTE — Progress Notes (Signed)
Pre visit review using our clinic review tool, if applicable. No additional management support is needed unless otherwise documented below in the visit note. 

## 2014-07-01 NOTE — Patient Instructions (Signed)
Otitis Media Otitis media is redness, soreness, and inflammation of the middle ear. Otitis media may be caused by allergies or, most commonly, by infection. Often it occurs as a complication of the common cold. SIGNS AND SYMPTOMS Symptoms of otitis media may include:  Earache.  Fever.  Ringing in your ear.  Headache.  Leakage of fluid from the ear. DIAGNOSIS To diagnose otitis media, your health care provider will examine your ear with an otoscope. This is an instrument that allows your health care provider to see into your ear in order to examine your eardrum. Your health care provider also will ask you questions about your symptoms. TREATMENT  Typically, otitis media resolves on its own within 3-5 days. Your health care provider may prescribe medicine to ease your symptoms of pain. If otitis media does not resolve within 5 days or is recurrent, your health care provider may prescribe antibiotic medicines if he or she suspects that a bacterial infection is the cause. HOME CARE INSTRUCTIONS   If you were prescribed an antibiotic medicine, finish it all even if you start to feel better.  Take medicines only as directed by your health care provider.  Keep all follow-up visits as directed by your health care provider. SEEK MEDICAL CARE IF:  You have otitis media only in one ear, or bleeding from your nose, or both.  You notice a lump on your neck.  You are not getting better in 3-5 days.  You feel worse instead of better. SEEK IMMEDIATE MEDICAL CARE IF:   You have pain that is not controlled with medicine.  You have swelling, redness, or pain around your ear or stiffness in your neck.  You notice that part of your face is paralyzed.  You notice that the bone behind your ear (mastoid) is tender when you touch it. MAKE SURE YOU:   Understand these instructions.  Will watch your condition.  Will get help right away if you are not doing well or get worse. Document Released:  08/11/2004 Document Revised: 03/23/2014 Document Reviewed: 06/03/2013 [facility] Patient Information 2015 [place], [place]. This information is not intended to replace advice given to you by your health care provider. Make sure you discuss any questions you have with your health care provider.

## 2014-07-02 ENCOUNTER — Encounter: Payer: Self-pay | Admitting: Family

## 2014-07-02 NOTE — Progress Notes (Signed)
Subjective:    Patient ID: Sheila Nunez, female    DOB: 21-May-1976, 38 y.o.   MRN: [id number]  HPI  38 year old AAF, nonsmoker, is today with c/o scratchy throat ear fullness, hoarseness x 1 week. hoarseness has improved but ear still full. Has a history of OM. Has been taking Xyzal and using a saline spray that has helped some.   Review of Systems  Constitutional: Negative.   HENT: Positive for congestion, ear pain, postnasal drip, rhinorrhea, sneezing and sore throat.   Respiratory: Negative.   Cardiovascular: Negative.   Genitourinary: Negative.   Musculoskeletal: Negative.   Skin: Negative.   Allergic/Immunologic: Positive for environmental allergies.  Neurological: Negative.   Psychiatric/Behavioral: Negative.    Past Medical History  Diagnosis Date  . Allergy     Dr. Allyson Sabal  . Eczema     Dr. Allyson Sabal    History   Social History  . Marital Status: Single    Spouse Name: N/A    Number of Children: N/A  . Years of Education: N/A   Occupational History  . Not on file.   Social History Main Topics  . Smoking status: Never Smoker   . Smokeless tobacco: Not on file  . Alcohol Use: 1.2 oz/week    2 Glasses of wine per week  . Drug Use: No  . Sexual Activity: Not on file   Other Topics Concern  . Not on file   Social History Narrative   Single, has one child age 44yo, works at [employer], Dance movement psychotherapist.  Exercise - sees trainer 2 x/wk, walks her dog daily, runs, walks    Past Surgical History  Procedure Laterality Date  . Cyst excision      eyelids, Dr. Gershon Crane    Family History  Problem Relation Age of Onset  . GER disease Brother   . Heart disease Neg Hx   . Diabetes Neg Hx   . Hypertension Neg Hx   . Cancer Neg Hx   . Stroke Neg Hx     Allergies  Allergen Reactions  . Zithromax [Azithromycin] Hives    Current Outpatient Prescriptions on File Prior to Visit  Medication Sig Dispense Refill  . escitalopram (LEXAPRO) 10 MG tablet TAKE 1  TABLET BY MOUTH EVERY DAY  30 tablet  1  . levocetirizine (XYZAL) 5 MG tablet Take 1 tablet (5 mg total) by mouth every evening.  30 tablet  0   No current facility-administered medications on file prior to visit.    BP 102/76  Pulse 75  Wt 181 lb (82.101 kg)chart    Objective:   Physical Exam  Constitutional: She is oriented to person, place, and time. She appears well-developed and well-nourished.  HENT:  Right Ear: External ear normal.  Nose: Nose normal.  Mouth/Throat: Oropharynx is clear and moist.  Left ear TM is red and inflamed. No drainage or discharge   Neck: Normal range of motion. Neck supple.  Cardiovascular: Normal rate, regular rhythm and normal heart sounds.   Pulmonary/Chest: Effort normal and breath sounds normal.  Neurological: She is alert and oriented to person, place, and time.  Skin: Skin is warm and dry.  Psychiatric: She has a normal mood and affect.          Assessment & Plan:  Rendi was seen today for ear fullness.  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 - fluticasone (FLONASE) 50 MCG/ACT nasal spray; Place 2 sprays into both nostrils daily.   Call the office with any questions or concerns. Recheck as scheduled and as needed.

## 2014-07-14 ENCOUNTER — Encounter: Payer: Self-pay | Admitting: Family

## 2014-07-14 ENCOUNTER — Ambulatory Visit (INDEPENDENT_AMBULATORY_CARE_PROVIDER_SITE_OTHER): Payer: BC Managed Care – PPO | Admitting: Family

## 2014-07-14 VITALS — BP 98/58 | HR 72 | Ht 62.5 in | Wt 180.0 lb

## 2014-07-14 DIAGNOSIS — Z23 Encounter for immunization: Secondary | ICD-10-CM

## 2014-07-14 DIAGNOSIS — Z0000 Encounter for general adult medical examination without abnormal findings: Secondary | ICD-10-CM

## 2014-07-14 DIAGNOSIS — J309 Allergic rhinitis, unspecified: Secondary | ICD-10-CM

## 2014-07-14 LAB — CBC WITH DIFFERENTIAL/PLATELET
Basophils Absolute: 0 10*3/uL (ref 0.0–0.1)
Basophils Relative: 0.5 % (ref 0.0–3.0)
Eosinophils Absolute: 0.1 10*3/uL (ref 0.0–0.7)
Eosinophils Relative: 1 % (ref 0.0–5.0)
HEMATOCRIT: 41.8 % (ref 36.0–46.0)
HEMOGLOBIN: 14.1 g/dL (ref 12.0–15.0)
LYMPHS PCT: 31.9 % (ref 12.0–46.0)
Lymphs Abs: 2 10*3/uL (ref 0.7–4.0)
MCHC: 33.6 g/dL (ref 30.0–36.0)
MCV: 88.7 fl (ref 78.0–100.0)
MONO ABS: 0.4 10*3/uL (ref 0.1–1.0)
Monocytes Relative: 6.1 % (ref 3.0–12.0)
NEUTROS PCT: 60.5 % (ref 43.0–77.0)
Neutro Abs: 3.8 10*3/uL (ref 1.4–7.7)
PLATELETS: 196 10*3/uL (ref 150.0–400.0)
RBC: 4.71 Mil/uL (ref 3.87–5.11)
RDW: 12.6 % (ref 11.5–15.5)
WBC: 6.2 10*3/uL (ref 4.0–10.5)

## 2014-07-14 LAB — LIPID PANEL
CHOLESTEROL: 195 mg/dL (ref 0–200)
HDL: 55.8 mg/dL (ref >39.00)
LDL CALC: 132 mg/dL — AB (ref 0–99)
NONHDL: 139.2
TRIGLYCERIDES: 38 mg/dL (ref 0.0–149.0)
Total CHOL/HDL Ratio: 3
VLDL: 7.6 mg/dL (ref 0.0–40.0)

## 2014-07-14 LAB — COMPREHENSIVE METABOLIC PANEL
ALT: 16 U/L (ref 0–35)
AST: 18 U/L (ref 0–37)
Albumin: 3.7 g/dL (ref 3.5–5.2)
Alkaline Phosphatase: 62 U/L (ref 39–117)
BILIRUBIN TOTAL: 0.6 mg/dL (ref 0.2–1.2)
BUN: 8 mg/dL (ref 6–23)
CALCIUM: 8.7 mg/dL (ref 8.4–10.5)
CHLORIDE: 101 meq/L (ref 96–112)
CO2: 30 meq/L (ref 19–32)
Creatinine, Ser: 0.7 mg/dL (ref 0.4–1.2)
GFR: 120.63 mL/min (ref >60.00)
Glucose, Bld: 77 mg/dL (ref 70–99)
Potassium: 3.6 mEq/L (ref 3.5–5.1)
Sodium: 135 mEq/L (ref 135–145)
Total Protein: 7.5 g/dL (ref 6.0–8.3)

## 2014-07-14 LAB — POCT URINALYSIS DIPSTICK
Bilirubin, UA: NEGATIVE
Glucose, UA: NEGATIVE
Ketones, UA: NEGATIVE
Leukocytes, UA: NEGATIVE
NITRITE UA: NEGATIVE
PROTEIN UA: NEGATIVE
RBC UA: NEGATIVE
Spec Grav, UA: 1.015
UROBILINOGEN UA: 0.2
pH, UA: 7

## 2014-07-14 LAB — TSH: TSH: 0.96 u[IU]/mL (ref 0.35–4.50)

## 2014-07-14 NOTE — Progress Notes (Signed)
Subjective:    Patient ID: Sheila Nunez, female    DOB: Oct 27, 1976, 38 y.o.   MRN: [id number]  HPI 38 year old AAF, nonsmoker, is in today for a CPX. Denies any concerns. Has a history of allergic rhinitis. Taking Flonase that helps.    Review of Systems  Constitutional: Negative.   HENT: Negative.   Eyes: Negative.   Respiratory: Negative.   Cardiovascular: Negative.   Gastrointestinal: Negative.   Endocrine: Negative.   Genitourinary: Negative.   Musculoskeletal: Negative.   Skin: Negative.   Allergic/Immunologic: Negative.   Neurological: Negative.   Hematological: Negative.   Psychiatric/Behavioral: Negative.    Past Medical History  Diagnosis Date  . Allergy     Dr. Allyson Sabal  . Eczema     Dr. Allyson Sabal    History   Social History  . Marital Status: Single    Spouse Name: N/A    Number of Children: N/A  . Years of Education: N/A   Occupational History  . Not on file.   Social History Main Topics  . Smoking status: Never Smoker   . Smokeless tobacco: Not on file  . Alcohol Use: 1.2 oz/week    2 Glasses of wine per week  . Drug Use: No  . Sexual Activity: Not on file   Other Topics Concern  . 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  Exercise - sees trainer 2 x/wk, walks her dog daily, runs, walks    Past Surgical History  Procedure Laterality Date  . Cyst excision      eyelids, Dr. Gershon Crane    Family History  Problem Relation Age of Onset  . GER disease Brother   . Heart disease Neg Hx   . Diabetes Neg Hx   . Hypertension Neg Hx   . Cancer Neg Hx   . Stroke Neg Hx     Allergies  Allergen Reactions  . Zithromax [Azithromycin] Hives    Current Outpatient Prescriptions on File Prior to Visit  Medication Sig Dispense Refill  . escitalopram (LEXAPRO) 10 MG tablet TAKE 1 TABLET BY MOUTH EVERY DAY  30 tablet  1  . fluticasone (FLONASE) 50 MCG/ACT nasal spray Place 2  sprays into both nostrils daily.  16 g  6  . levocetirizine (XYZAL) 5 MG tablet Take 1 tablet (5 mg total) by mouth every evening.  30 tablet  0   No current facility-administered medications on file prior to visit.    BP 98/58  Pulse 72  Ht 5' 2.5" (1.588 m)  Wt 180 lb (81.647 kg)  BMI 32.38 kg/m2chart     Objective:   Physical Exam  Constitutional: She is oriented to person, place, and time. She appears well-developed and well-nourished.  HENT:  Head: Normocephalic and atraumatic.  Right Ear: External ear normal.  Left Ear: External ear normal.  Nose: Nose normal.  Mouth/Throat: Oropharynx is clear and moist.  Eyes: Conjunctivae and EOM are normal. Pupils are equal, round, and reactive to light.  Neck: Normal range of motion. Neck supple. No thyromegaly present.  Cardiovascular: Normal rate, regular rhythm and normal heart sounds.   Pulmonary/Chest: Effort normal and breath sounds normal.  Abdominal: Soft. Bowel sounds are normal.  Musculoskeletal: Normal range of motion. She exhibits no edema and no tenderness.  Neurological: She is alert and oriented to person, place, and time. She has normal reflexes. She displays normal reflexes. No  cranial nerve deficit. Coordination normal.  Skin: Skin is warm and dry.  Psychiatric: She has a normal mood and affect.          Assessment & Plan:  Cicilia was seen today for annual exam.  Diagnoses and associated orders for this visit:  Preventative health care - CMP - CBC with Differential - TSH - Lipid Panel - POCT urinalysis dipstick  Allergic rhinitis, unspecified allergic rhinitis type   Call the office with any questions or concerns. Recheck in 6 months and sooner as needed. Exercise to reduce weight, monthly self breast exams, safe sex practices.

## 2014-07-14 NOTE — Progress Notes (Signed)
Pre visit review using our clinic review tool, if applicable. No additional management support is needed unless otherwise documented below in the visit note. 

## 2014-07-14 NOTE — Patient Instructions (Signed)
Breast Self-Awareness Practicing breast self-awareness may pick up problems early, prevent significant medical complications, and possibly save your life. By practicing breast self-awareness, you can become familiar with how your breasts look and feel and if your breasts are changing. This allows you to notice changes early. It can also offer you some reassurance that your breast health is good. One way to learn what is normal for your breasts and whether your breasts are changing is to do a breast self-exam. If you find a lump or something that was not present in the past, it is best to contact your caregiver right away. Other findings that should be evaluated by your caregiver include nipple discharge, especially if it is bloody; skin changes or reddening; areas where the skin seems to be pulled in (retracted); or new lumps and bumps. Breast pain is seldom associated with cancer (malignancy), but should also be evaluated by a caregiver. HOW TO PERFORM A BREAST SELF-EXAM The best time to examine your breasts is 5-7 days after your menstrual period is over. During menstruation, the breasts are lumpier, and it may be more difficult to pick up changes. If you do not menstruate, have reached menopause, or had your uterus removed (hysterectomy), you should examine your breasts at regular intervals, such as monthly. If you are breastfeeding, examine your breasts after a feeding or after using a breast pump. Breast implants do not decrease the risk for lumps or tumors, so continue to perform breast self-exams as recommended. Talk to your caregiver about how to determine the difference between the implant and breast tissue. Also, talk about the amount of pressure you should use during the exam. Over time, you will become more familiar with the variations of your breasts and more comfortable with the exam. A breast self-exam requires you to remove all your clothes above the waist. 1. Look at your breasts and nipples.  Stand in front of a mirror in a room with good lighting. With your hands on your hips, push your hands firmly downward. Look for a difference in shape, contour, and size from one breast to the other (asymmetry). Asymmetry includes puckers, dips, or bumps. Also, look for skin changes, such as reddened or scaly areas on the breasts. Look for nipple changes, such as discharge, dimpling, repositioning, or redness. 2. Carefully feel your breasts. This is best done either in the shower or tub while using soapy water or when flat on your back. Place the arm (on the side of the breast you are examining) above your head. Use the pads (not the fingertips) of your three middle fingers on your opposite hand to feel your breasts. Start in the underarm area and use  inch (2 cm) overlapping circles to feel your breast. Use 3 different levels of pressure (light, medium, and firm pressure) at each circle before moving to the next circle. The light pressure is needed to feel the tissue closest to the skin. The medium pressure will help to feel breast tissue a little deeper, while the firm pressure is needed to feel the tissue close to the ribs. Continue the overlapping circles, moving downward over the breast until you feel your ribs below your breast. Then, move one finger-width towards the center of the body. Continue to use the  inch (2 cm) overlapping circles to feel your breast as you move slowly up toward the collar bone (clavicle) near the base of the neck. Continue the up and down exam using all 3 pressures until you reach the  middle of the chest. Do this with each breast, carefully feeling for lumps or changes. 3.  Keep a written record with breast changes or normal findings for each breast. By writing this information down, you do not need to depend only on memory for size, tenderness, or location. Write down where you are in your menstrual cycle, if you are still menstruating. Breast tissue can have some lumps or  thick tissue. However, see your caregiver if you find anything that concerns you.  SEEK MEDICAL CARE IF:  You see a change in shape, contour, or size of your breasts or nipples.   You see skin changes, such as reddened or scaly areas on the breasts or nipples.   You have an unusual discharge from your nipples.   You feel a new lump or unusually thick areas.  Document Released: 11/06/2005 Document Revised: 10/23/2012 Document Reviewed: 02/21/2012 [facility] Patient Information 2015 [place], [place]. This information is not intended to replace advice given to you by your health care provider. Make sure you discuss any questions you have with your health care provider.

## 2014-07-14 NOTE — Addendum Note (Signed)
Addended by: Santiago Bumpers on: 07/14/2014 10:28 AM   Modules accepted: Orders

## 2014-07-21 ENCOUNTER — Ambulatory Visit (INDEPENDENT_AMBULATORY_CARE_PROVIDER_SITE_OTHER): Payer: BC Managed Care – PPO | Admitting: Licensed Clinical Social Worker

## 2014-07-21 DIAGNOSIS — F331 Major depressive disorder, recurrent, moderate: Secondary | ICD-10-CM

## 2014-08-27 ENCOUNTER — Other Ambulatory Visit: Payer: Self-pay | Admitting: Family

## 2014-12-07 ENCOUNTER — Encounter: Payer: Self-pay | Admitting: Family Medicine

## 2014-12-07 ENCOUNTER — Ambulatory Visit (INDEPENDENT_AMBULATORY_CARE_PROVIDER_SITE_OTHER): Payer: BLUE CROSS/BLUE SHIELD | Admitting: Family Medicine

## 2014-12-07 VITALS — BP 121/91 | HR 82 | Temp 98.7°F | Ht 62.5 in | Wt 186.0 lb

## 2014-12-07 DIAGNOSIS — J0100 Acute maxillary sinusitis, unspecified: Secondary | ICD-10-CM

## 2014-12-07 MED ORDER — FLUCONAZOLE 150 MG PO TABS: 150.0000 mg | ORAL_TABLET | Freq: Once | ORAL | Status: DC

## 2014-12-07 MED ORDER — AMOXICILLIN 875 MG PO TABS: 875.0000 mg | ORAL_TABLET | Freq: Two times a day (BID) | ORAL | Status: DC

## 2014-12-07 NOTE — Progress Notes (Signed)
   Subjective:    Patient ID: Sheila Nunez, female    DOB: 06/30/1976, 39 y.o.   MRN: [id number]  HPI Here for 4 days of sinus pressure, PND, ST , and a dry cough.    Review of Systems  Constitutional: Negative.   HENT: Positive for congestion, postnasal drip and sinus pressure.   Eyes: Negative.   Respiratory: Positive for cough.        Objective:   Physical Exam  Constitutional: She appears well-developed and well-nourished.  HENT:  Right Ear: External ear normal.  Left Ear: External ear normal.  Nose: Nose normal.  Mouth/Throat: Oropharynx is clear and moist.  Eyes: Conjunctivae are normal.  Pulmonary/Chest: Effort normal and breath sounds normal.  Lymphadenopathy:    She has no cervical adenopathy.          Assessment & Plan:  Add Mucinex.

## 2014-12-07 NOTE — Progress Notes (Signed)
Pre visit review using our clinic review tool, if applicable. No additional management support is needed unless otherwise documented below in the visit note. 

## 2014-12-15 ENCOUNTER — Telehealth: Payer: Self-pay | Admitting: Family

## 2014-12-15 NOTE — Telephone Encounter (Signed)
Pt was seen on 1-18 by dr fry and her cough is getting worst. Pt was given zpak. [facility] [address]. Please advise

## 2014-12-15 NOTE — Telephone Encounter (Signed)
Call in Levaquin 500 mg daily for 10 days. Also Benzonatate 200 mg bid prn cough, #60 with no rf

## 2014-12-16 MED ORDER — LEVOFLOXACIN 500 MG PO TABS: 500.0000 mg | ORAL_TABLET | Freq: Every day | ORAL | Status: DC

## 2014-12-16 MED ORDER — BENZONATATE 200 MG PO CAPS: 200.0000 mg | ORAL_CAPSULE | Freq: Two times a day (BID) | ORAL | Status: DC | PRN

## 2014-12-16 NOTE — Telephone Encounter (Signed)
I sent both scripts e-scribe and left a voice message for pt with this information.

## 2014-12-31 ENCOUNTER — Other Ambulatory Visit: Payer: Self-pay | Admitting: Family

## 2015-01-11 ENCOUNTER — Other Ambulatory Visit (INDEPENDENT_AMBULATORY_CARE_PROVIDER_SITE_OTHER): Payer: BLUE CROSS/BLUE SHIELD

## 2015-01-11 ENCOUNTER — Encounter: Payer: Self-pay | Admitting: *Deleted

## 2015-01-11 ENCOUNTER — Ambulatory Visit (INDEPENDENT_AMBULATORY_CARE_PROVIDER_SITE_OTHER): Payer: BLUE CROSS/BLUE SHIELD | Admitting: Family Medicine

## 2015-01-11 ENCOUNTER — Encounter: Payer: Self-pay | Admitting: Family Medicine

## 2015-01-11 VITALS — BP 120/80 | HR 90 | Ht 62.0 in | Wt 185.0 lb

## 2015-01-11 DIAGNOSIS — S66809A Unspecified injury of other specified muscles, fascia and tendons at wrist and hand level, unspecified hand, initial encounter: Secondary | ICD-10-CM

## 2015-01-11 DIAGNOSIS — M25531 Pain in right wrist: Secondary | ICD-10-CM

## 2015-01-11 DIAGNOSIS — S6980XA Other specified injuries of unspecified wrist, hand and finger(s), initial encounter: Secondary | ICD-10-CM

## 2015-01-11 NOTE — Progress Notes (Signed)
[facility] [place], [place] [address] Phone: [phone] Subjective:    I'm seeing this patient by the request  of:  CAMPBELL, PADONDA BOYD, FNP   CC: Right wrist and thumb  [id number] [name] is a 39 y.o. female coming in with complaint of wrist and thumb pain. Patient does not remember exactly which one started. Patient states this seems to be more painful around the right thumbnail. Patient states that this is been going on for 4 months and does not seem to be improving. Patient states that unfortunately the pain can be severe in her thumb when she does certain movements. Patient states when she tries to open something and causes a significant amount of pain. Patient denies any numbness or tingling but states that she does feel that it is weaker than her other side. Patient has tried over-the-counter anti-inflammatories and no significant improvement. Patient states that icing regimen has helped a little bit. Patient does not remember exactly what the injury was that caused it. Patient rates the severity of pain when it occurs as 9 out of 10 but only last a few seconds. Patient though states that it is affecting her daily activities.     Past medical history, social, surgical and family history all reviewed in electronic medical record.   Review of Systems: No headache, visual changes, nausea, vomiting, diarrhea, constipation, dizziness, abdominal pain, skin rash, fevers, chills, night sweats, weight loss, swollen lymph nodes, body aches, joint swelling, muscle aches, chest pain, shortness of breath, mood changes.   Objective Blood pressure 120/80, pulse 90, height 5' 2"  (1.575 m), weight 185 lb (83.915 kg), SpO2 95 %.  General: No apparent distress alert and oriented x3 mood and affect normal, dressed appropriately.  HEENT: Pupils equal, extraocular movements intact  Respiratory: Patient's speak in full sentences and does not appear  short of breath  Cardiovascular: No lower extremity edema, non tender, no erythema  Skin: Warm dry intact with no signs of infection or rash on extremities or on axial skeleton.  Abdomen: Soft nontender  Neuro: Cranial nerves II through XII are intact, neurovascularly intact in all extremities with 2+ DTRs and 2+ pulses.  Lymph: No lymphadenopathy of posterior or anterior cervical chain or axillae bilaterally.  Gait normal with good balance and coordination.  MSK:  Non tender with full range of motion and good stability and symmetric strength and tone of shoulders, elbows,  hip, knee and ankles bilaterally.  Wrist: Right Inspection normal with no visible erythema or swelling. ROM smooth and normal with good flexion and extension and ulnar/radial deviation that is symmetrical with opposite wrist. Palpation is normal over metacarpals, navicular, lunate, and TFCC; tendons without tenderness/ swelling No snuffbox tenderness. No tenderness over Canal of [place]. Strength 5/5 in all directions without pain. Negative Finkelstein, tinel's and phalens. Negative Watson's test. Patient though does have gapping of the UCL compared to the contralateral side. Pain with this gapping.   contralateral wrist unremarkable.  MSK US performed of: Right This study was ordered, performed, and interpreted by Charlann Boxer D.O.  Wrist: All extensor compartments visualized and tendons all normal in appearance without fraying, tears, or sheath effusions. Mild tendon effusion of the abductor pollicis longus No effusion seen. TFCC intact. Scapholunate ligament intact. Carpal tunnel visualized and median nerve area normal, flexor tendons all normal in appearance without fraying, tears, or sheath effusions. Patient does have gapping of the UCL compared to the contralateral side. Mild hypoechoic  changes of the [facility] joint but no significant bony abnormality.  IMPRESSION:  Grade 2 UCL injury      Impression and  Recommendations:     This case required medical decision making of moderate complexity.

## 2015-01-11 NOTE — Assessment & Plan Note (Signed)
Patient does have an ulnar collateral ligament injury. I do think the patient may respond well to conservative therapy. We discussed icing regimen, home exercises, as well as patient put in a thumb spica splint that she will wear 23 hours a day for the next 2 weeks. Patient given a note at work to wear her cast as well. Patient and will come back and see me again in 3 weeks for further evaluation. Patient given a trial topical anti-inflammatories as well as a trial of oral anti-inflammatories. Patient does not make any significant improvement we will need to consider nitroglycerin patches and further imaging.

## 2015-01-11 NOTE — Patient Instructions (Signed)
Good to see you Ice 20 minutes 2 times daily. Usually after activity and before bed. OK to exercise in the brace.  Try pennsaid twice daily.  Vitamin D 2000 IU daily Turmeric 527m twice daily See me again in 3 weeks.

## 2015-01-11 NOTE — Progress Notes (Signed)
Pre visit review using our clinic review tool, if applicable. No additional management support is needed unless otherwise documented below in the visit note. 

## 2015-02-01 ENCOUNTER — Ambulatory Visit (INDEPENDENT_AMBULATORY_CARE_PROVIDER_SITE_OTHER)
Admission: RE | Admit: 2015-02-01 | Discharge: 2015-02-01 | Disposition: A | Discharge status: Home / Self Care | Payer: BLUE CROSS/BLUE SHIELD | Source: Ambulatory Visit | Attending: Family Medicine | Admitting: Family Medicine

## 2015-02-01 ENCOUNTER — Ambulatory Visit (INDEPENDENT_AMBULATORY_CARE_PROVIDER_SITE_OTHER): Payer: BLUE CROSS/BLUE SHIELD | Admitting: Family Medicine

## 2015-02-01 ENCOUNTER — Encounter: Payer: Self-pay | Admitting: Family Medicine

## 2015-02-01 VITALS — BP 116/80 | HR 92 | Ht 62.0 in | Wt 185.0 lb

## 2015-02-01 DIAGNOSIS — S6981XS Other specified injuries of right wrist, hand and finger(s), sequela: Secondary | ICD-10-CM

## 2015-02-01 DIAGNOSIS — S66809A Unspecified injury of other specified muscles, fascia and tendons at wrist and hand level, unspecified hand, initial encounter: Secondary | ICD-10-CM

## 2015-02-01 DIAGNOSIS — S6980XA Other specified injuries of unspecified wrist, hand and finger(s), initial encounter: Secondary | ICD-10-CM

## 2015-02-01 NOTE — Patient Instructions (Addendum)
Good to see you Moving in right direction Xray downstairs today.  Ice still at the end of the day. Wear brace out of the house and at night for 2 weeks.  Continue to do exercises to avoid wrist getting tight.  See me again in 3 weeks.

## 2015-02-01 NOTE — Progress Notes (Signed)
  [facility] [place], [place] [address] Phone: [phone] Subjective:     CC: Right wrist and thumb follow-up  [id number] TERILYN SANO is a 39 y.o. female coming in with complaint of wrist and thumb pain. Patient was seen previously and was diagnosed with an ulnar collateral ligament injury. Patient has been in the thumb spica since then. Patient has been coming out of it from time to time and doing the home exercises. Patient states that she is approximately 4050% better. Still has discomfort if she has not wearing the brace. Denies any weakness. Denies any new symptoms.     Past medical history, social, surgical and family history all reviewed in electronic medical record.   Review of Systems: No headache, visual changes, nausea, vomiting, diarrhea, constipation, dizziness, abdominal pain, skin rash, fevers, chills, night sweats, weight loss, swollen lymph nodes, body aches, joint swelling, muscle aches, chest pain, shortness of breath, mood changes.   Objective Blood pressure 116/80, pulse 92, height 5' 2"  (1.575 m), weight 185 lb (83.915 kg), last menstrual period 01/28/2015, SpO2 99 %.  General: No apparent distress alert and oriented x3 mood and affect normal, dressed appropriately.  HEENT: Pupils equal, extraocular movements intact  Respiratory: Patient's speak in full sentences and does not appear short of breath  Cardiovascular: No lower extremity edema, non tender, no erythema  Skin: Warm dry intact with no signs of infection or rash on extremities or on axial skeleton.  Abdomen: Soft nontender  Neuro: Cranial nerves II through XII are intact, neurovascularly intact in all extremities with 2+ DTRs and 2+ pulses.  Lymph: No lymphadenopathy of posterior or anterior cervical chain or axillae bilaterally.  Gait normal with good balance and coordination.  MSK:  Non tender with full range of motion and good stability and symmetric  strength and tone of shoulders, elbows,  hip, knee and ankles bilaterally.  Wrist: Right Inspection normal with no visible erythema or swelling. ROM smooth and normal with good flexion and extension and ulnar/radial deviation that is symmetrical with opposite wrist. Palpation is normal over metacarpals, navicular, lunate, and TFCC; tendons without tenderness/ swelling No snuffbox tenderness. No tenderness over Canal of [place]. Strength 5/5 in all directions without pain. Negative Finkelstein, tinel's and phalens. Negative Watson's test. Decreased gapping compared to previous exam.  contralateral wrist unremarkable.  MSK US performed of: Right This study was ordered, performed, and interpreted by Charlann Boxer D.O.  Wrist: All extensor compartments visualized and tendons all normal in appearance without fraying, tears, or sheath effusions. Mild tendon effusion of the abductor pollicis longus No effusion seen. TFCC intact. Scapholunate ligament intact. Carpal tunnel visualized and median nerve area normal, flexor tendons all normal in appearance without fraying, tears, or sheath effusions. Patient does have gapping of the UCL compared to the contralateral side but significantly less In the previous exam. Increasing calcific changes.. Mild hypoechoic changes of the [facility] joint but no significant bony abnormality.  IMPRESSION:  Grade 2 UCL injury healing but patient also has a flexor tendon nodule noted.      Impression and Recommendations:     This case required medical decision making of moderate complexity.

## 2015-02-01 NOTE — Progress Notes (Signed)
Pre visit review using our clinic review tool, if applicable. No additional management support is needed unless otherwise documented below in the visit note. 

## 2015-02-01 NOTE — Assessment & Plan Note (Signed)
Patient has had some interval healing compared to previous exam. We are making some progress. Encourage patient to continue the brace for another 3 weeks. Discussed with patient that this usually takes summer between 6 and 8 weeks to actually heal. Hopefully at that time patient will have no laxity and we will start with more strength training. We discussed coming out of the brace and doing some range of motion exercises but no lifting. We discussed the possibility of nitroglycerin but secondary to patient's skin reaction she wants to avoid it. Patient and will see me again in 3 weeks for further evaluation and treatment.

## 2015-02-22 ENCOUNTER — Ambulatory Visit (INDEPENDENT_AMBULATORY_CARE_PROVIDER_SITE_OTHER): Payer: BLUE CROSS/BLUE SHIELD | Admitting: Family Medicine

## 2015-02-22 ENCOUNTER — Other Ambulatory Visit (INDEPENDENT_AMBULATORY_CARE_PROVIDER_SITE_OTHER): Payer: BLUE CROSS/BLUE SHIELD

## 2015-02-22 ENCOUNTER — Encounter: Payer: Self-pay | Admitting: Family Medicine

## 2015-02-22 VITALS — BP 110/80 | HR 78 | Ht 62.0 in | Wt 186.0 lb

## 2015-02-22 DIAGNOSIS — S6980XA Other specified injuries of unspecified wrist, hand and finger(s), initial encounter: Secondary | ICD-10-CM | POA: Diagnosis not present

## 2015-02-22 DIAGNOSIS — S6982XA Other specified injuries of left wrist, hand and finger(s), initial encounter: Secondary | ICD-10-CM

## 2015-02-22 DIAGNOSIS — S66809A Unspecified injury of other specified muscles, fascia and tendons at wrist and hand level, unspecified hand, initial encounter: Secondary | ICD-10-CM

## 2015-02-22 DIAGNOSIS — S66802A Unspecified injury of other specified muscles, fascia and tendons at wrist and hand level, left hand, initial encounter: Secondary | ICD-10-CM

## 2015-02-22 NOTE — Progress Notes (Signed)
  [facility] [place], [place] [address] Phone: [phone] Subjective:     CC: Right wrist and thumb follow-up  [id number] Sheila Nunez is a 39 y.o. female coming in with complaint of wrist and thumb pain. Patient was seen previously and was diagnosed with an ulnar collateral ligament injury.  Patient has been doing conservative therapy for the last 6 weeks. Patient was approximately 50% better at last exam. Patient states she continues to do better. States about 90%.  Patient states that she continues to wear the brace regularly. Patient denies any new symptoms.     Past medical history, social, surgical and family history all reviewed in electronic medical record.   Review of Systems: No headache, visual changes, nausea, vomiting, diarrhea, constipation, dizziness, abdominal pain, skin rash, fevers, chills, night sweats, weight loss, swollen lymph nodes, body aches, joint swelling, muscle aches, chest pain, shortness of breath, mood changes.   Objective Blood pressure 110/80, pulse 78, height 5' 2"  (1.575 m), weight 186 lb (84.369 kg), last menstrual period 01/28/2015, SpO2 98 %.  General: No apparent distress alert and oriented x3 mood and affect normal, dressed appropriately.  HEENT: Pupils equal, extraocular movements intact  Respiratory: Patient's speak in full sentences and does not appear short of breath  Cardiovascular: No lower extremity edema, non tender, no erythema  Skin: Warm dry intact with no signs of infection or rash on extremities or on axial skeleton.  Abdomen: Soft nontender  Neuro: Cranial nerves II through XII are intact, neurovascularly intact in all extremities with 2+ DTRs and 2+ pulses.  Lymph: No lymphadenopathy of posterior or anterior cervical chain or axillae bilaterally.  Gait normal with good balance and coordination.  MSK:  Non tender with full range of motion and good stability and symmetric strength and  tone of shoulders, elbows,  hip, knee and ankles bilaterally.  Wrist: Right Inspection normal with no visible erythema or swelling. ROM smooth and normal with good flexion and extension and ulnar/radial deviation that is symmetrical with opposite wrist. Palpation is normal over metacarpals, navicular, lunate, and TFCC; tendons without tenderness/ swelling No snuffbox tenderness. No tenderness over Canal of [place]. Strength 5/5 in all directions without pain. Negative Finkelstein, tinel's and phalens. Negative Watson's test. Patient now has an endpoint to the UCL that still laxity compared to contralateral side.  contralateral wrist unremarkable.  MSK US performed of: Right This study was ordered, performed, and interpreted by Charlann Boxer D.O.  Wrist: All extensor compartments visualized and tendons all normal in appearance without fraying, tears, or sheath effusions. Mild tendon effusion of the abductor pollicis longus No effusion seen. TFCC intact. Scapholunate ligament intact. Carpal tunnel visualized and median nerve area normal, flexor tendons all normal in appearance without fraying, tears, or sheath effusions. Patient does have laxity but no gapping of the UCL compared to the contralateral sid. Decreased hypoechoic changes of the [facility] joint.  IMPRESSION:  Grade 2 UCL near complete resolution     Impression and Recommendations:     This case required medical decision making of moderate complexity.

## 2015-02-22 NOTE — Patient Instructions (Addendum)
Good to see you Ice is your friend especially at night You can come out of the brace at home and small errands but no heavy lifting for 2 weeks.  Lets get you doing more of the exercises regularly, expect discomfort at first but no significant pain. Still wear brace at night for 2 weeks.  Call me in 2 weeks if pain is worsening See me again in 3-4 weeks.

## 2015-02-22 NOTE — Assessment & Plan Note (Signed)
Patient is now 6 weeks out from injury. Discussed with patient at this time we went to start increasing activities. Patient will come out of the brace during the day will continue to wear the brace at night for the next 2 weeks. We discussed continuing the topical anti-inflammatory and continue some of the other medications. We discussed with patient that if patient has any worsening symptoms we need to consider advanced imaging. I do believe the patient will continue to improve but will be slowly. Patient will hopefully be pain-free within the next 6 weeks. Patient will follow-up in 3-4 weeks for further evaluation and treatment.  Spent  25 minutes with patient face-to-face and had greater than 50% of counseling including as described above in assessment and plan.

## 2015-02-22 NOTE — Progress Notes (Signed)
Pre visit review using our clinic review tool, if applicable. No additional management support is needed unless otherwise documented below in the visit note. 

## 2015-03-15 ENCOUNTER — Ambulatory Visit (INDEPENDENT_AMBULATORY_CARE_PROVIDER_SITE_OTHER): Payer: BLUE CROSS/BLUE SHIELD | Admitting: Family Medicine

## 2015-03-15 ENCOUNTER — Encounter: Payer: Self-pay | Admitting: Family Medicine

## 2015-03-15 VITALS — BP 112/72 | HR 98 | Ht 62.0 in | Wt 186.0 lb

## 2015-03-15 DIAGNOSIS — S66809A Unspecified injury of other specified muscles, fascia and tendons at wrist and hand level, unspecified hand, initial encounter: Secondary | ICD-10-CM

## 2015-03-15 DIAGNOSIS — S6980XA Other specified injuries of unspecified wrist, hand and finger(s), initial encounter: Secondary | ICD-10-CM | POA: Diagnosis not present

## 2015-03-15 NOTE — Assessment & Plan Note (Signed)
Patient did very well with conservative therapy at this point. Patient will continue with icing and knows that when patient increase his activity she will likely have some discomfort. If any worsening symptoms patient will come back for further evaluation otherwise she will see me as needed.

## 2015-03-15 NOTE — Progress Notes (Signed)
  [facility] [place] [address] [place], [place] [address] Phone: [phone] Subjective:     CC: Right wrist and thumb follow-up  BPZ:[id number] ALAHNA DUNNE is a 39 y.o. female coming in with complaint of wrist and thumb pain. Patient was seen previously and was diagnosed with an ulnar collateral ligament injury.  Patient has been doing conservative therapy for the last 6 weeks. Patient is doing significantly better at this time. Patient states only some mild soreness when she is in a yoga pose. No numbness or tingling or any new symptoms. Rates the severity of pain a 0-10. Able to do daily activities without any significant pain.     Past medical history, social, surgical and family history all reviewed in electronic medical record.   Review of Systems: No headache, visual changes, nausea, vomiting, diarrhea, constipation, dizziness, abdominal pain, skin rash, fevers, chills, night sweats, weight loss, swollen lymph nodes, body aches, joint swelling, muscle aches, chest pain, shortness of breath, mood changes.   Objective Blood pressure 112/72, pulse 98, height 5' 2"  (1.575 m), weight 186 lb (84.369 kg), SpO2 97 %.  General: No apparent distress alert and oriented x3 mood and affect normal, dressed appropriately.  HEENT: Pupils equal, extraocular movements intact  Respiratory: Patient's speak in full sentences and does not appear short of breath  Cardiovascular: No lower extremity edema, non tender, no erythema  Skin: Warm dry intact with no signs of infection or rash on extremities or on axial skeleton.  Abdomen: Soft nontender  Neuro: Cranial nerves II through XII are intact, neurovascularly intact in all extremities with 2+ DTRs and 2+ pulses.  Lymph: No lymphadenopathy of posterior or anterior cervical chain or axillae bilaterally.  Gait normal with good balance and coordination.  MSK:  Non tender with full range of motion and good stability and symmetric  strength and tone of shoulders, elbows,  hip, knee and ankles bilaterally.  Wrist: Right Inspection normal with no visible erythema or swelling. ROM smooth and normal with good flexion and extension and ulnar/radial deviation that is symmetrical with opposite wrist. Palpation is normal over metacarpals, navicular, lunate, and TFCC; tendons without tenderness/ swelling No snuffbox tenderness. No tenderness over Canal of [place]. Strength 5/5 in all directions without pain. Negative Finkelstein, tinel's and phalens. Negative Watson's test. Patient's UCL mostly healed.  contralateral wrist unremarkable.  MSK US performed of: Right This study was ordered, performed, and interpreted by Charlann Boxer D.O.  Wrist: All extensor compartments visualized and tendons all normal in appearance without fraying, tears, or sheath effusions. Mild tendon effusion of the abductor pollicis longus No effusion seen. TFCC intact. Scapholunate ligament intact. Carpal tunnel visualized and median nerve area normal, flexor tendons all normal in appearance without fraying, tears, or sheath effusions. Patient does have laxity but no gapping of the UCL compared to the contralateral sid. No hypoechoic changes.  IMPRESSION:  Grade 2 UCL with complete healing     Impression and Recommendations:     This case required medical decision making of moderate complexity.

## 2015-03-15 NOTE — Progress Notes (Signed)
Pre visit review using our clinic review tool, if applicable. No additional management support is needed unless otherwise documented below in the visit note. 

## 2015-03-15 NOTE — Patient Instructions (Signed)
Good to see you Sheila Nunez is your friend still Start increasing activity and expect some discomfort If pain come on back otherwise see me when you need me.

## 2015-06-17 ENCOUNTER — Ambulatory Visit (INDEPENDENT_AMBULATORY_CARE_PROVIDER_SITE_OTHER): Payer: BLUE CROSS/BLUE SHIELD | Admitting: Family Medicine

## 2015-06-17 ENCOUNTER — Encounter: Payer: Self-pay | Admitting: Family Medicine

## 2015-06-17 VITALS — BP 112/62 | HR 78 | Ht 62.0 in | Wt 186.0 lb

## 2015-06-17 DIAGNOSIS — G5710 Meralgia paresthetica, unspecified lower limb: Secondary | ICD-10-CM

## 2015-06-17 DIAGNOSIS — G5711 Meralgia paresthetica, right lower limb: Secondary | ICD-10-CM | POA: Diagnosis not present

## 2015-06-17 HISTORY — DX: Meralgia paresthetica, unspecified lower limb: G57.10

## 2015-06-17 NOTE — Patient Instructions (Signed)
Good to see you Ice or heat pennsaid pinkie amount topically 2 times daily as needed.  Try compression Lateral femoral nerve cutaneus nerve See me again in 3 weeks if not perfect

## 2015-06-17 NOTE — Progress Notes (Signed)
  [facility] [place], [place] [address] Phone: [phone] Subjective:    CC: Numbness in right thigh  [id number] Sheila Nunez is a 39 y.o. female coming in with complaint of right thigh numbness.  Patient states she has had this intermittent for approximately 2 weeks but for the last 3 days it's been constant. Denies any back pain with it. Denies any weakness. States that she can still feel pressure but light sensation is more of a pinpricking sensation. Patient's denies that it is anything painful. Patient just has never had any length this before. No new medications or new activities. Does not remember any true injury. Able to sleep at night. Patient is concerned because she has never had anything like this previously.     Past medical history, social, surgical and family history all reviewed in electronic medical record.   Review of Systems: No headache, visual changes, nausea, vomiting, diarrhea, constipation, dizziness, abdominal pain, skin rash, fevers, chills, night sweats, weight loss, swollen lymph nodes, body aches, joint swelling, muscle aches, chest pain, shortness of breath, mood changes.   Objective Blood pressure 112/62, pulse 78, height 5' 2"  (1.575 m), weight 186 lb (84.369 kg), SpO2 99 %.  General: No apparent distress alert and oriented x3 mood and affect normal, dressed appropriately.  HEENT: Pupils equal, extraocular movements intact  Respiratory: Patient's speak in full sentences and does not appear short of breath  Cardiovascular: No lower extremity edema, non tender, no erythema  Skin: Warm dry intact with no signs of infection or rash on extremities or on axial skeleton.  Abdomen: Soft nontender  Neuro: Cranial nerves II through XII are intact, neurovascularly intact in all extremities with 2+ DTRs and 2+ pulses.  Lymph: No lymphadenopathy of posterior or anterior cervical chain or axillae bilaterally.  Gait normal  with good balance and coordination.  MSK:  Non tender with full range of motion and good stability and symmetric strength and tone of shoulders, elbows, wrist, hip, knee and ankles bilaterally.  Back Exam:  Inspection: Unremarkable  Motion: Flexion 45 deg, Extension 45 deg, Side Bending to 45 deg bilaterally,  Rotation to 45 deg bilaterally  SLR laying: Negative  XSLR laying: Negative  Palpable tenderness: None. FABER: negative. Sensory change: Gross sensation intact to all lumbar and sacral dermatomes.  Reflexes: 2+ at both patellar tendons, 2+ at achilles tendons, Babinski's downgoing.  Strength at foot  Plantar-flexion: 5/5 Dorsi-flexion: 5/5 Eversion: 5/5 Inversion: 5/5  Leg strength  Quad: 5/5 Hamstring: 5/5 Hip flexor: 5/5 Hip abductors: 5/5  Gait unremarkable. Patient states some mild numbness or tingling sensation on the anterior lateral aspect to proximally 4 cm above the knee   Impression and Recommendations:     This case required medical decision making of moderate complexity.

## 2015-06-17 NOTE — Progress Notes (Signed)
Pre visit review using our clinic review tool, if applicable. No additional management support is needed unless otherwise documented below in the visit note. 

## 2015-06-17 NOTE — Assessment & Plan Note (Signed)
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 This avoiding significant contact in the area of the compression could be beneficial. Patient try topical anti-inflammatory disease as well as increasing her activity. I do not see any signs of vascular compromise. Patient has no back pain so radicular symptoms and not likely. This will likely resolve on its own within only been 3 days. We will see how patient doesn't respond. Patient come back again in 2-3 weeks if not fully improved.

## 2015-06-25 ENCOUNTER — Other Ambulatory Visit: Payer: Self-pay | Admitting: *Deleted

## 2015-06-25 MED ORDER — GABAPENTIN 100 MG PO CAPS: 200.0000 mg | ORAL_CAPSULE | Freq: Every day | ORAL | Status: DC

## 2015-06-25 NOTE — Telephone Encounter (Signed)
Pt made aware gabapentin 149m, 2 tab qhs was sent into pharmacy.

## 2015-06-28 LAB — HEPATIC FUNCTION PANEL
ALK PHOS: 73 U/L (ref 25–125)
ALT: 12 U/L (ref 7–35)
AST: 13 U/L (ref 13–35)
Bilirubin, Total: 0.3 mg/dL

## 2015-06-28 LAB — LIPID PANEL
HDL: 51 mg/dL (ref 35–70)
LDL Cholesterol: 132 mg/dL
LDl/HDL Ratio: 4
Triglycerides: 67 mg/dL (ref 40–160)

## 2015-06-28 LAB — CBC AND DIFFERENTIAL
HCT: 426 % — AB (ref 36–46)
Hemoglobin: 14.2 g/dL (ref 12.0–16.0)
PLATELETS: 183 10*3/uL (ref 150–399)
WBC: 7.5 10*3/mL

## 2015-06-28 LAB — BASIC METABOLIC PANEL
BUN: 10 mg/dL (ref 4–21)
CREATININE: 0.7 mg/dL (ref 0.5–1.1)
GLUCOSE: 91 mg/dL
POTASSIUM: 4.2 mmol/L (ref 3.4–5.3)
Sodium: 136 mmol/L — AB (ref 137–147)

## 2015-07-13 ENCOUNTER — Ambulatory Visit: Payer: BLUE CROSS/BLUE SHIELD | Admitting: Family Medicine

## 2015-07-19 ENCOUNTER — Encounter: Payer: Self-pay | Admitting: Family Medicine

## 2015-07-19 ENCOUNTER — Other Ambulatory Visit: Payer: Self-pay | Admitting: Family Medicine

## 2015-07-19 ENCOUNTER — Ambulatory Visit
Admission: RE | Admit: 2015-07-19 | Discharge: 2015-07-19 | Disposition: A | Discharge status: Home / Self Care | Payer: BLUE CROSS/BLUE SHIELD | Source: Ambulatory Visit | Attending: Family Medicine | Admitting: Family Medicine

## 2015-07-19 ENCOUNTER — Ambulatory Visit (INDEPENDENT_AMBULATORY_CARE_PROVIDER_SITE_OTHER): Payer: BLUE CROSS/BLUE SHIELD | Admitting: Family Medicine

## 2015-07-19 VITALS — BP 110/72 | HR 62 | Temp 98.3°F | Ht 62.0 in | Wt 186.8 lb

## 2015-07-19 DIAGNOSIS — R9389 Abnormal findings on diagnostic imaging of other specified body structures: Secondary | ICD-10-CM

## 2015-07-19 DIAGNOSIS — R938 Abnormal findings on diagnostic imaging of other specified body structures: Secondary | ICD-10-CM | POA: Diagnosis not present

## 2015-07-19 NOTE — Progress Notes (Signed)
Pre visit review using our clinic review tool, if applicable. No additional management support is needed unless otherwise documented below in the visit note. 

## 2015-07-19 NOTE — Progress Notes (Signed)
   Subjective:    Patient ID: Sheila Nunez, female    DOB: 09-Mar-1976, 39 y.o.   MRN: [id number]  HPI  Patient is here to establish at this office, she was a prior lumbar patient. She also needs a follow-up on an abnormal chest x-ray found at another facility.   Abnormal chest x-ray: Patient reports she had an exit interview for her job that she recently resigned from. 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 She denies any trauma to her chest, or any symptoms of shortness of breath or pain. The x-ray showed a possible lesion in the left apical lobe versus first costochondral joint space. Patient denies any family history of cancer, she has never smoked, she denies any unintentional weight loss or fatigue. Her recent lab work shows normal cell lines. The x-ray in question was only a 1 view, as recommended to have AP lateral views.  Past Medical History  Diagnosis Date  . Allergy     Dr. Allyson Sabal  . Eczema     Dr. Allyson Sabal  . Depression    Allergies  Allergen Reactions  . Zithromax [Azithromycin] Hives   Past Surgical History  Procedure Laterality Date  . Cyst excision      eyelids, Dr. Gershon Crane   Family History  Problem Relation Age of Onset  . GER disease Brother   . Heart disease Neg Hx   . Diabetes Neg Hx   . Hypertension Neg Hx   . Cancer Neg Hx   . Stroke Neg Hx    Social History   Social History  . Marital Status: Single    Spouse Name: N/A  . Number of Children: N/A  . Years of Education: N/A   Occupational History  . Not on file.   Social History Main Topics  . Smoking status: Never Smoker   . Smokeless tobacco: Never Used  . Alcohol Use: 1.2 oz/week    2 Glasses of wine per week  . Drug Use: No  . Sexual Activity: Yes   Other Topics Concern  . Not on file   Social History Narrative   Single, has one child age 35yo, was working [employer], Dance movement psychotherapist (certidied medical  responder), between jobs right now.  Exercise - sees trainer 2 x/wk, walks her dog daily, runs, walks   Review of Systems Negative, with the exception of above mentioned in HPI     Objective:   Physical Exam BP 110/72 mmHg  Pulse 62  Temp(Src) 98.3 F (36.8 C) (Oral)  Ht 5' 2"  (1.575 m)  Wt 186 lb 12 oz (84.709 kg)  BMI 34.15 kg/m2  SpO2 97%  LMP 07/01/2015 Gen: Afebrile. No acute distress. Nontoxic in appearance. Well-developed, well-nourished pleasant female. HENT: AT. .MMM.   Neck: Supple, no lymphadenopathy, no thyromegaly CV: RRR no murmur, no edema Chest: Clear to auscultation bilaterally no wheezes, rhonchi or rales. No tenderness to palpation, no masses. Lymph: No lymphadenopathy cervical, supraclavicular or axillary     Assessment & Plan:  1. Abnormal chest x-ray - Patient with abnormal chest x-ray at [facility], found on a routine physical for an exit interview. Small left first costochondral joint versus possible parenchymal left apical lung lesion. She is asymptomatic. Additional views are required, this was a 1 view x-ray. - DG Chest 4 View; Future - Patient will be called with results once they become available,

## 2015-07-19 NOTE — Patient Instructions (Signed)
Annual exam will be due 03/2016, we will complete your PAP at that time.

## 2015-07-20 ENCOUNTER — Telehealth: Payer: Self-pay | Admitting: Family Medicine

## 2015-07-20 NOTE — Telephone Encounter (Signed)
Please call pt, her xray results showed no lung mass. The area they saw on prior xray was degenerative (wearing) changes in the first rib/sternal joint. No concerns.

## 2015-07-20 NOTE — Telephone Encounter (Signed)
Patient aware.

## 2016-04-07 ENCOUNTER — Other Ambulatory Visit: Payer: BLUE CROSS/BLUE SHIELD

## 2016-04-14 ENCOUNTER — Encounter: Payer: BLUE CROSS/BLUE SHIELD | Admitting: Family Medicine

## 2016-05-22 ENCOUNTER — Other Ambulatory Visit (INDEPENDENT_AMBULATORY_CARE_PROVIDER_SITE_OTHER): Payer: BLUE CROSS/BLUE SHIELD

## 2016-05-22 DIAGNOSIS — Z130 Encounter for screening for diseases of the blood and blood-forming organs and certain disorders involving the immune mechanism: Secondary | ICD-10-CM | POA: Diagnosis not present

## 2016-05-22 DIAGNOSIS — Z1321 Encounter for screening for nutritional disorder: Secondary | ICD-10-CM

## 2016-05-22 DIAGNOSIS — Z13220 Encounter for screening for lipoid disorders: Secondary | ICD-10-CM

## 2016-05-22 DIAGNOSIS — Z0000 Encounter for general adult medical examination without abnormal findings: Secondary | ICD-10-CM

## 2016-05-22 DIAGNOSIS — Z1329 Encounter for screening for other suspected endocrine disorder: Secondary | ICD-10-CM

## 2016-05-22 LAB — VITAMIN D 25 HYDROXY (VIT D DEFICIENCY, FRACTURES): VITD: 18.83 ng/mL — ABNORMAL LOW (ref 30.00–100.00)

## 2016-05-22 LAB — COMPREHENSIVE METABOLIC PANEL
ALBUMIN: 4 g/dL (ref 3.5–5.2)
ALK PHOS: 71 U/L (ref 39–117)
ALT: 11 U/L (ref 0–35)
AST: 11 U/L (ref 0–37)
BUN: 11 mg/dL (ref 6–23)
CO2: 27 mEq/L (ref 19–32)
Calcium: 9.2 mg/dL (ref 8.4–10.5)
Chloride: 104 mEq/L (ref 96–112)
Creatinine, Ser: 0.79 mg/dL (ref 0.40–1.20)
GFR: 103.9 mL/min (ref >60.00)
GLUCOSE: 99 mg/dL (ref 70–99)
POTASSIUM: 4.4 meq/L (ref 3.5–5.1)
Sodium: 137 mEq/L (ref 135–145)
TOTAL PROTEIN: 7.1 g/dL (ref 6.0–8.3)
Total Bilirubin: 0.4 mg/dL (ref 0.2–1.2)

## 2016-05-22 LAB — CBC WITH DIFFERENTIAL/PLATELET
BASOS PCT: 0.7 % (ref 0.0–3.0)
Basophils Absolute: 0 10*3/uL (ref 0.0–0.1)
EOS PCT: 1 % (ref 0.0–5.0)
Eosinophils Absolute: 0.1 10*3/uL (ref 0.0–0.7)
HCT: 41.7 % (ref 36.0–46.0)
Hemoglobin: 13.9 g/dL (ref 12.0–15.0)
LYMPHS ABS: 2 10*3/uL (ref 0.7–4.0)
Lymphocytes Relative: 30.9 % (ref 12.0–46.0)
MCHC: 33.4 g/dL (ref 30.0–36.0)
MCV: 85.5 fl (ref 78.0–100.0)
MONOS PCT: 5.5 % (ref 3.0–12.0)
Monocytes Absolute: 0.4 10*3/uL (ref 0.1–1.0)
NEUTROS PCT: 61.9 % (ref 43.0–77.0)
Neutro Abs: 4 10*3/uL (ref 1.4–7.7)
Platelets: 189 10*3/uL (ref 150.0–400.0)
RBC: 4.88 Mil/uL (ref 3.87–5.11)
RDW: 13.7 % (ref 11.5–15.5)
WBC: 6.5 10*3/uL (ref 4.0–10.5)

## 2016-05-22 LAB — TSH: TSH: 2.34 u[IU]/mL (ref 0.35–4.50)

## 2016-05-22 LAB — LIPID PANEL
Cholesterol: 174 mg/dL (ref 0–200)
HDL: 49.3 mg/dL (ref >39.00)
LDL Cholesterol: 116 mg/dL — ABNORMAL HIGH (ref 0–99)
NonHDL: 124.52
TRIGLYCERIDES: 43 mg/dL (ref 0.0–149.0)
Total CHOL/HDL Ratio: 4
VLDL: 8.6 mg/dL (ref 0.0–40.0)

## 2016-05-25 ENCOUNTER — Ambulatory Visit (INDEPENDENT_AMBULATORY_CARE_PROVIDER_SITE_OTHER): Payer: BLUE CROSS/BLUE SHIELD | Admitting: Family Medicine

## 2016-05-25 ENCOUNTER — Encounter: Payer: Self-pay | Admitting: Family Medicine

## 2016-05-25 VITALS — BP 106/77 | HR 74 | Temp 98.5°F | Resp 20 | Ht 62.0 in | Wt 193.8 lb

## 2016-05-25 DIAGNOSIS — R6889 Other general symptoms and signs: Secondary | ICD-10-CM | POA: Diagnosis not present

## 2016-05-25 DIAGNOSIS — E559 Vitamin D deficiency, unspecified: Secondary | ICD-10-CM | POA: Diagnosis not present

## 2016-05-25 DIAGNOSIS — Z0001 Encounter for general adult medical examination with abnormal findings: Secondary | ICD-10-CM | POA: Diagnosis not present

## 2016-05-25 MED ORDER — VITAMIN D (ERGOCALCIFEROL) 1.25 MG (50000 UNIT) PO CAPS
50000.0000 [IU] | ORAL_CAPSULE | ORAL | Status: DC
Start: 2016-05-25 — End: 2016-08-09

## 2016-05-25 NOTE — Patient Instructions (Signed)
Health Maintenance, Female Adopting a healthy lifestyle and getting preventive care can go a long way to promote health and wellness. Talk with your health care provider about what schedule of regular examinations is right for you. This is a good chance for you to check in with your provider about disease prevention and staying healthy. In between checkups, there are plenty of things you can do on your own. Experts have done a lot of research about which lifestyle changes and preventive measures are most likely to keep you healthy. Ask your health care provider for more information. WEIGHT AND DIET  Eat a healthy diet  Be sure to include plenty of vegetables, fruits, low-fat dairy products, and lean protein.  Do not eat a lot of foods high in solid fats, added sugars, or salt.  Get regular exercise. This is one of the most important things you can do for your health.  Most adults should exercise for at least 150 minutes each week. The exercise should increase your heart rate and make you sweat (moderate-intensity exercise).  Most adults should also do strengthening exercises at least twice a week. This is in addition to the moderate-intensity exercise.  Maintain a healthy weight  Body mass index (BMI) is a measurement that can be used to identify possible weight problems. It estimates body fat based on height and weight. Your health care provider can help determine your BMI and help you achieve or maintain a healthy weight.  For females 20 years of age and older:   A BMI below 18.5 is considered underweight.  A BMI of 18.5 to 24.9 is normal.  A BMI of 25 to 29.9 is considered overweight.  A BMI of 30 and above is considered obese.  Watch levels of cholesterol and blood lipids  You should start having your blood tested for lipids and cholesterol at 40 years of age, then have this test every 5 years.  You may need to have your cholesterol levels checked more often if:  Your lipid  or cholesterol levels are high.  You are older than 40 years of age.  You are at high risk for heart disease.  CANCER SCREENING   Lung Cancer  Lung cancer screening is recommended for adults 55-80 years old who are at high risk for lung cancer because of a history of smoking.  A yearly low-dose CT scan of the lungs is recommended for people who:  Currently smoke.  Have quit within the past 15 years.  Have at least a 30-pack-year history of smoking. A pack year is smoking an average of one pack of cigarettes a day for 1 year.  Yearly screening should continue until it has been 15 years since you quit.  Yearly screening should stop if you develop a health problem that would prevent you from having lung cancer treatment.  Breast Cancer  Practice breast self-awareness. This means understanding how your breasts normally appear and feel.  It also means doing regular breast self-exams. Let your health care provider know about any changes, no matter how small.  If you are in your 20s or 30s, you should have a clinical breast exam (CBE) by a health care provider every 1-3 years as part of a regular health exam.  If you are 40 or older, have a CBE every year. Also consider having a breast X-ray (mammogram) every year.  If you have a family history of breast cancer, talk to your health care provider about genetic screening.  If you   are at high risk for breast cancer, talk to your health care provider about having an MRI and a mammogram every year.  Breast cancer gene (BRCA) assessment is recommended for women who have family members with BRCA-related cancers. BRCA-related cancers include:  Breast.  Ovarian.  Tubal.  Peritoneal cancers.  Results of the assessment will determine the need for genetic counseling and BRCA1 and BRCA2 testing. Cervical Cancer Your health care provider may recommend that you be screened regularly for cancer of the pelvic organs (ovaries, uterus, and  vagina). This screening involves a pelvic examination, including checking for microscopic changes to the surface of your cervix (Pap test). You may be encouraged to have this screening done every 3 years, beginning at age 21.  For women ages 30-65, health care providers may recommend pelvic exams and Pap testing every 3 years, or they may recommend the Pap and pelvic exam, combined with testing for human papilloma virus (HPV), every 5 years. Some types of HPV increase your risk of cervical cancer. Testing for HPV may also be done on women of any age with unclear Pap test results.  Other health care providers may not recommend any screening for nonpregnant women who are considered low risk for pelvic cancer and who do not have symptoms. Ask your health care provider if a screening pelvic exam is right for you.  If you have had past treatment for cervical cancer or a condition that could lead to cancer, you need Pap tests and screening for cancer for at least 20 years after your treatment. If Pap tests have been discontinued, your risk factors (such as having a new sexual partner) need to be reassessed to determine if screening should resume. Some women have medical problems that increase the chance of getting cervical cancer. In these cases, your health care provider may recommend more frequent screening and Pap tests. Colorectal Cancer  This type of cancer can be detected and often prevented.  Routine colorectal cancer screening usually begins at 40 years of age and continues through 40 years of age.  Your health care provider may recommend screening at an earlier age if you have risk factors for colon cancer.  Your health care provider may also recommend using home test kits to check for hidden blood in the stool.  A small camera at the end of a tube can be used to examine your colon directly (sigmoidoscopy or colonoscopy). This is done to check for the earliest forms of colorectal  cancer.  Routine screening usually begins at age 50.  Direct examination of the colon should be repeated every 5-10 years through 40 years of age. However, you may need to be screened more often if early forms of precancerous polyps or small growths are found. Skin Cancer  Check your skin from head to toe regularly.  Tell your health care provider about any new moles or changes in moles, especially if there is a change in a mole's shape or color.  Also tell your health care provider if you have a mole that is larger than the size of a pencil eraser.  Always use sunscreen. Apply sunscreen liberally and repeatedly throughout the day.  Protect yourself by wearing long sleeves, pants, a wide-brimmed hat, and sunglasses whenever you are outside. HEART DISEASE, DIABETES, AND HIGH BLOOD PRESSURE   High blood pressure causes heart disease and increases the risk of stroke. High blood pressure is more likely to develop in:  People who have blood pressure in the high end   of the normal range (130-139/85-89 mm Hg).  People who are overweight or obese.  People who are African American.  If you are 38-23 years of age, have your blood pressure checked every 3-5 years. If you are 61 years of age or older, have your blood pressure checked every year. You should have your blood pressure measured twice--once when you are at a hospital or clinic, and once when you are not at a hospital or clinic. Record the average of the two measurements. To check your blood pressure when you are not at a hospital or clinic, you can use:  An automated blood pressure machine at a pharmacy.  A home blood pressure monitor.  If you are between 45 years and 39 years old, ask your health care provider if you should take aspirin to prevent strokes.  Have regular diabetes screenings. This involves taking a blood sample to check your fasting blood sugar level.  If you are at a normal weight and have a low risk for diabetes,  have this test once every three years after 40 years of age.  If you are overweight and have a high risk for diabetes, consider being tested at a younger age or more often. PREVENTING INFECTION  Hepatitis B  If you have a higher risk for hepatitis B, you should be screened for this virus. You are considered at high risk for hepatitis B if:  You were born in a country where hepatitis B is common. Ask your health care provider which countries are considered high risk.  Your parents were born in a high-risk country, and you have not been immunized against hepatitis B (hepatitis B vaccine).  You have HIV or AIDS.  You use needles to inject street drugs.  You live with someone who has hepatitis B.  You have had sex with someone who has hepatitis B.  You get hemodialysis treatment.  You take certain medicines for conditions, including cancer, organ transplantation, and autoimmune conditions. Hepatitis C  Blood testing is recommended for:  Everyone born from [birth year] through 1965.  Anyone with known risk factors for hepatitis C. Sexually transmitted infections (STIs)  You should be screened for sexually transmitted infections (STIs) including gonorrhea and chlamydia if:  You are sexually active and are younger than 40 years of age.  You are older than 40 years of age and your health care provider tells you that you are at risk for this type of infection.  Your sexual activity has changed since you were last screened and you are at an increased risk for chlamydia or gonorrhea. Ask your health care provider if you are at risk.  If you do not have HIV, but are at risk, it may be recommended that you take a prescription medicine daily to prevent HIV infection. This is called pre-exposure prophylaxis (PrEP). You are considered at risk if:  You are sexually active and do not regularly use condoms or know the HIV status of your partner(s).  You take drugs by injection.  You are sexually  active with a partner who has HIV. Talk with your health care provider about whether you are at high risk of being infected with HIV. If you choose to begin PrEP, you should first be tested for HIV. You should then be tested every 3 months for as long as you are taking PrEP.  PREGNANCY   If you are premenopausal and you may become pregnant, ask your health care provider about preconception counseling.  If you may  become pregnant, take 400 to 800 micrograms (mcg) of folic acid every day.  If you want to prevent pregnancy, talk to your health care provider about birth control (contraception). OSTEOPOROSIS AND MENOPAUSE   Osteoporosis is a disease in which the bones lose minerals and strength with aging. This can result in serious bone fractures. Your risk for osteoporosis can be identified using a bone density scan.  If you are 53 years of age or older, or if you are at risk for osteoporosis and fractures, ask your health care provider if you should be screened.  Ask your health care provider whether you should take a calcium or vitamin D supplement to lower your risk for osteoporosis.  Menopause may have certain physical symptoms and risks.  Hormone replacement therapy may reduce some of these symptoms and risks. Talk to your health care provider about whether hormone replacement therapy is right for you.  HOME CARE INSTRUCTIONS   Schedule regular health, dental, and eye exams.  Stay current with your immunizations.   Do not use any tobacco products including cigarettes, chewing tobacco, or electronic cigarettes.  If you are pregnant, do not drink alcohol.  If you are breastfeeding, limit how much and how often you drink alcohol.  Limit alcohol intake to no more than 1 drink per day for nonpregnant women. One drink equals 12 ounces of beer, 5 ounces of wine, or 1 ounces of hard liquor.  Do not use street drugs.  Do not share needles.  Ask your health care provider for help if  you need support or information about quitting drugs.  Tell your health care provider if you often feel depressed.  Tell your health care provider if you have ever been abused or do not feel safe at home.   This information is not intended to replace advice given to you by your health care provider. Make sure you discuss any questions you have with your health care provider.   Document Released: 05/22/2011 Document Revised: 11/27/2014 Document Reviewed: 10/08/2013 Elsevier Interactive Patient Education [employer].  Your Vitamin D is low - I will call in a supplement for once a week for 12 weeks.

## 2016-05-25 NOTE — Progress Notes (Signed)
Patient ID: Sheila Nunez, female   DOB: 05-30-1976, 40 y.o.   MRN: [id number]     Patient ID: Sheila Nunez, female  DOB: October 11, 1976, 40 y.o.   MRN: [id number] Patient Care Team    Relationship Specialty Notifications Start End  Ma Hillock, DO PCP - General Family Medicine  07/19/15   Tiajuana Amass, MD Referring Physician Allergy and Immunology  05/25/16     Subjective:  Sheila Nunez is a 40 y.o.  Female  present for CPE. All past medical history, surgical history, allergies, family history, immunizations, medications and social history were updated in the electronic medical record today. All recent labs, ED visits and hospitalizations within the last year were reviewed.  Health maintenance:  Colonoscopy: No FHx, screen at 50. Mammogram: No fhx. Screen at age 58.  Cervical cancer screening: 04/07/2013. last pap, results: normal. Immunizations: tdap 06/2014, Influenza (encouraged yearly) Infectious disease screening: HIV completed DEXA:N/A Assistive device: None  Oxygen use: None Patient has a Dental home. Hospitalizations/ED visits: None  Depression screen [facility] 2/9 05/25/2016  Decreased Interest 0  Down, Depressed, Hopeless 0  PHQ - 2 Score 0    Current Exercise Habits: The patient does not participate in regular exercise at present    Immunization History  Administered Date(s) Administered  . Tdap 07/14/2014     Past Medical History  Diagnosis Date  . Allergy     Dr. Allyson Sabal  . Eczema     Dr. Allyson Sabal  . Depression    Allergies  Allergen Reactions  . Zithromax [Azithromycin] Hives   Past Surgical History  Procedure Laterality Date  . Cyst excision      eyelids, Dr. Gershon Crane   Family History  Problem Relation Age of Onset  . GER disease Brother   . Cancer Neg Hx    Social History   Social History  . Marital Status: Single    Spouse Name: N/A  . Number of Children: N/A  . Years of Education: N/A   Occupational History  . Not on file.   Social  History Main Topics  . Smoking status: Never Smoker   . Smokeless tobacco: Never Used  . Alcohol Use: 1.2 oz/week    2 Glasses of wine per week  . Drug Use: No  . Sexual Activity: Yes   Other Topics Concern  . Not on file   Social History Narrative   Single. 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   Never smoker, occ etoh, no drugs.            Medication List       This list is accurate as of: 05/25/16  8:40 AM.  Always use your most recent med list.               fluticasone 50 MCG/ACT nasal spray  Commonly known as:  FLONASE  Place 2 sprays into both nostrils daily.     levocetirizine 5 MG tablet  Commonly known as:  XYZAL  Take 1 tablet (5 mg total) by mouth every evening.     Vitamin D (Ergocalciferol) 50000 units Caps capsule  Commonly known as:  DRISDOL  Take 1 capsule (50,000 Units total) by mouth every 7 (seven) days.         Recent Results (from the past 2160 hour(s))  CBC w/Diff     Status: None   Collection Time:  05/22/16  8:04 AM  Result Value Ref Range   WBC 6.5 4.0 - 10.5 K/uL   RBC 4.88 3.87 - 5.11 Mil/uL   Hemoglobin 13.9 12.0 - 15.0 g/dL   HCT 41.7 36.0 - 46.0 %   MCV 85.5 78.0 - 100.0 fl   MCHC 33.4 30.0 - 36.0 g/dL   RDW 13.7 11.5 - 15.5 %   Platelets 189.0 150.0 - 400.0 K/uL   Neutrophils Relative % 61.9 43.0 - 77.0 %   Lymphocytes Relative 30.9 12.0 - 46.0 %   Monocytes Relative 5.5 3.0 - 12.0 %   Eosinophils Relative 1.0 0.0 - 5.0 %   Basophils Relative 0.7 0.0 - 3.0 %   Neutro Abs 4.0 1.4 - 7.7 K/uL   Lymphs Abs 2.0 0.7 - 4.0 K/uL   Monocytes Absolute 0.4 0.1 - 1.0 K/uL   Eosinophils Absolute 0.1 0.0 - 0.7 K/uL   Basophils Absolute 0.0 0.0 - 0.1 K/uL  Comp Met (CMET)     Status: None   Collection Time: 05/22/16  8:04 AM  Result Value Ref Range   Sodium 137 135 - 145 mEq/L   Potassium 4.4 3.5 - 5.1 mEq/L   Chloride 104 96 - 112 mEq/L   CO2 27 19 -  32 mEq/L   Glucose, Bld 99 70 - 99 mg/dL   BUN 11 6 - 23 mg/dL   Creatinine, Ser 0.79 0.40 - 1.20 mg/dL   Total Bilirubin 0.4 0.2 - 1.2 mg/dL   Alkaline Phosphatase 71 39 - 117 U/L   AST 11 0 - 37 U/L   ALT 11 0 - 35 U/L   Total Protein 7.1 6.0 - 8.3 g/dL   Albumin 4.0 3.5 - 5.2 g/dL   Calcium 9.2 8.4 - 10.5 mg/dL   GFR 103.90 >60.00 mL/min  TSH     Status: None   Collection Time: 05/22/16  8:04 AM  Result Value Ref Range   TSH 2.34 0.35 - 4.50 uIU/mL  Lipid Profile     Status: Abnormal   Collection Time: 05/22/16  8:04 AM  Result Value Ref Range   Cholesterol 174 0 - 200 mg/dL    Comment: ATP III Classification       Desirable:  < 200 mg/dL               Borderline High:  200 - 239 mg/dL          High:  > = 240 mg/dL   Triglycerides 43.0 0.0 - 149.0 mg/dL    Comment: Normal:  <150 mg/dLBorderline High:  150 - 199 mg/dL   HDL 49.30 >39.00 mg/dL   VLDL 8.6 0.0 - 40.0 mg/dL   LDL Cholesterol 116 (H) 0 - 99 mg/dL   Total CHOL/HDL Ratio 4     Comment:                Men          Women1/2 Average Risk     3.4          3.3Average Risk          5.0          4.42X Average Risk          9.6          7.13X Average Risk          15.0          11.0  NonHDL 124.52     Comment: NOTE:  Non-HDL goal should be 30 mg/dL higher than patient's LDL goal (i.e. LDL goal of < 70 mg/dL, would have non-HDL goal of < 100 mg/dL)  Vitamin D (25 hydroxy)     Status: Abnormal   Collection Time: 05/22/16  8:04 AM  Result Value Ref Range   VITD 18.83 (L) 30.00 - 100.00 ng/mL      ROS: 14 pt review of systems performed and negative (unless mentioned in an HPI)  Objective: BP 106/77 mmHg  Pulse 74  Temp(Src) 98.5 F (36.9 C)  Resp 20  Ht 5' 2"  (1.575 m)  Wt 193 lb 12.8 oz (87.907 kg)  BMI 35.44 kg/m2  SpO2 97%  LMP 05/24/2016 Gen: Afebrile. No acute distress. Nontoxic in appearance, well-developed, well-nourished,  Pleasant AAF HENT: AT. [place]. Bilateral TM visualized and normal in  appearance, normal external auditory canal. MMM, no oral lesions, adequate dentition. Bilateral nares with mild erythema and swelling L>R. Throat without erythema, ulcerations or exudates. no Cough on exam, no hoarseness on exam. Eyes:Pupils Equal Round Reactive to light, Extraocular movements intact,  Conjunctiva without redness, discharge or icterus. Neck/lymp/endocrine: Supple,no lymphadenopathy, no thyromegaly CV: RRR no murmur, no edema, +2/4 P posterior tibialis pulses.  Chest: CTAB, no wheeze, rhonchi or crackles. normal Respiratory effort. good Air movement. Abd: Soft. round. NTND. BS present. no Masses palpated. No hepatosplenomegaly. No rebound tenderness or guarding. Skin: no rashes, purpura or petechiae. Warm and well-perfused. Skin intact. Neuro/Msk:  Normal gait. PERLA. EOMi. Alert. Oriented x3.  Cranial nerves II through XII intact. Muscle strength 5/5 upper/lower extremity. DTRs equal bilaterally. Psych: Normal affect, dress and demeanor. Normal speech. Normal thought content and judgment.   Assessment/plan: Sheila Nunez is a 40 y.o. female present for CPE. Vitamin D deficiency - Pt will supplement with OTC as well.  - Vitamin D, Ergocalciferol, (DRISDOL) 50000 units CAPS capsule; Take 1 capsule (50,000 Units total) by mouth every 7 (seven) days.  Dispense: 8 capsule; Refill: 0 - VITAMIN D 25 Hydroxy (Vit-D Deficiency, Fractures); Future  Encounter for general adult medical examination with abnormal findings Patient was encouraged to exercise greater than 150 minutes a week. Patient was encouraged to choose a diet filled with fresh fruits and vegetables, and lean meats. AVS provided to patient today for education/recommendation on gender specific health and safety maintenance. - all labs reviewed.  - UTD with immunizations - PAP will be scheduled for this year. Currently on her menses, and unable to perform today - mammogram screening to start next year.  - F/U 1 year for  CPE - F/U in 2 mos for repeat D and PAP  Return in about 2 months (around 07/26/2016) for pap and vit d.  Electronically signed by: Howard Pouch, DO [place]

## 2016-07-31 ENCOUNTER — Ambulatory Visit: Payer: BLUE CROSS/BLUE SHIELD | Admitting: Family Medicine

## 2016-08-03 ENCOUNTER — Other Ambulatory Visit (INDEPENDENT_AMBULATORY_CARE_PROVIDER_SITE_OTHER): Payer: BLUE CROSS/BLUE SHIELD

## 2016-08-03 DIAGNOSIS — E559 Vitamin D deficiency, unspecified: Secondary | ICD-10-CM | POA: Diagnosis not present

## 2016-08-03 LAB — VITAMIN D 25 HYDROXY (VIT D DEFICIENCY, FRACTURES): VITD: 30.9 ng/mL (ref 30.00–100.00)

## 2016-08-09 ENCOUNTER — Ambulatory Visit (INDEPENDENT_AMBULATORY_CARE_PROVIDER_SITE_OTHER): Payer: BLUE CROSS/BLUE SHIELD | Admitting: Family Medicine

## 2016-08-09 ENCOUNTER — Other Ambulatory Visit (HOSPITAL_COMMUNITY)
Admission: RE | Admit: 2016-08-09 | Discharge: 2016-08-09 | Disposition: A | Discharge status: Home / Self Care | Payer: BLUE CROSS/BLUE SHIELD | Source: Ambulatory Visit | Attending: Family Medicine | Admitting: Family Medicine

## 2016-08-09 ENCOUNTER — Encounter: Payer: Self-pay | Admitting: Family Medicine

## 2016-08-09 VITALS — BP 105/74 | HR 80 | Temp 99.2°F | Resp 20 | Ht 62.0 in | Wt 193.8 lb

## 2016-08-09 DIAGNOSIS — J302 Other seasonal allergic rhinitis: Secondary | ICD-10-CM

## 2016-08-09 DIAGNOSIS — Z01419 Encounter for gynecological examination (general) (routine) without abnormal findings: Secondary | ICD-10-CM | POA: Insufficient documentation

## 2016-08-09 DIAGNOSIS — E559 Vitamin D deficiency, unspecified: Secondary | ICD-10-CM | POA: Diagnosis not present

## 2016-08-09 DIAGNOSIS — Z124 Encounter for screening for malignant neoplasm of cervix: Secondary | ICD-10-CM | POA: Diagnosis not present

## 2016-08-09 DIAGNOSIS — Z1151 Encounter for screening for human papillomavirus (HPV): Secondary | ICD-10-CM | POA: Insufficient documentation

## 2016-08-09 NOTE — Addendum Note (Signed)
Addended by: Leota Jacobsen on: 08/09/2016 11:53 AM   Modules accepted: Miquel Dunn

## 2016-08-09 NOTE — Patient Instructions (Signed)
Cervical Cancer The cervix is the opening and bottom part of the uterus between the vagina and the uterus. Cervical cancer is a fairly common cancer. It occurs most often in women between the ages of 40 years and 65 years. Cells of the cervix act very much like skin cells. These cells are exposed to toxins, viruses, and bacteria that may cause abnormal changes.  There are two kinds of cancers of the cervix:   Squamous cell carcinoma. This type of cancer starts in the flat or scale-like cells that line the cervix. Squamous cell carcinoma can develop from a sexually transmitted infection caused by the human papillomavirus (HPV).  Adenocarcinoma. This type of cervical cancer starts in glandular cells that line the cervix. RISK FACTORS The risk of getting cancer of the cervix is related to your lifestyle, sexual history, health, and immune system. Risks for cervical cancer include:   Having a sexually transmitted viral infection. These include:  Chlamydia.   Herpes.   HPV.  Becoming sexually active before age 107 years.  Having more than one sexual partner or having sex with someone who has more than one sexual partner.  Not using condoms with sexual partners.  Having had cancer of the vagina or vulva.  Having a sexual partner who has or had cancer of the penis or who has had a sexual partner with abnormal cervical cells (dysplasia) or cervical cancer.  Using oral contraceptives (also called birth control pills).  Smoking.   Having a weakened immune system. For example, human immunodeficiency virus (HIV) or other immune deficiency disorders.  Being the daughter of a woman who took diethylstilbestrol (DES) during pregnancy.  Having a sister or mother who has had cancer of the cervix.  Being [place] American, Hispanic, Asian, or a woman from the [place].  A history of dysplasia of the cervix. SIGNS AND SYMPTOMS  Symptoms are usually not present in the early stages of  cervical cancer. Once the cancer invades the cervix and surrounding tissues, the woman may have:   Abnormal vaginal bleeding or menstrual bleeding that is longer or heavier than usual.  Bleeding after intercourse, douching, or a Pap test.  Vaginal bleeding following menopause.  Abnormal vaginal discharge.  Pelvic discomfort or pain.  An abnormal Pap test.  Pain during sexual intercourse. Symptoms of more advanced cervical cancer may include:   Loss of appetite or weight loss.  Tiredness (fatigue).  Back and leg pain.  Inability to control urination or bowel movements. DIAGNOSIS  A pelvic exam and Pap test are done to diagnose the condition. If abnormalities are found during the exam or Pap test, the Pap test may be repeated in 3 months, or your health care provider may do additional tests or procedures, such as:   A colposcopy. This is a procedure that uses a special microscope that allows the health care provider to magnify and closely examine the cells of the cervix, vagina, and vulva.  Cervical biopsies. This is a procedure where small tissue samples are taken from the cervix to be examined under a microscope by a specialist.   A cone biopsy. This is a procedure to test for or remove cancerous tissue. Other tests may be needed, including:   Cystoscopy.   Proctoscopy or sigmoidoscopy.  Ultrasound.   CT scan.   MRI.   Laparoscopy.  There are different stages of cervical cancer:   Stage 0, carcinoma in situ (CIS)--This first stage of cancer is the last and most serious stage of  dysplasia.  Stage I--This means the tumor is in the uterus and cervix only.  Stage II--This means the tumor has spread to the upper vagina. The cancer has spread beyond the uterus but not to the pelvic walls or lower third of the vagina.  Stage III--This means the tumor has invaded the side wall of the pelvis and the lower third of the vagina. If the tumor blocks the tubes that  carry urine to the bladder (ureters), it may cause urine to back up and the kidneys to swell (hydronephrosis).  Stage IV--This means the tumor has spread to the rectum or bladder. In the later part of this stage, it has also spread to distant organs, like the lungs. TREATMENT  Treatment options can include:   Cone biopsy to remove the cancerous tissue.   Removal of the entire uterus and cervix.   Removal of the uterus, cervix, upper vagina, lymph nodes, and surrounding tissue (modified radical hysterectomy). The ovaries may be left in place or removed.  Medicines to treat cancer.   A combination of surgery, radiation, and chemotherapy.   Biological response modifiers. These are substances that help strengthen your immune system's fight against cancer or infection. They may be used in combination with chemotherapy. HOME CARE INSTRUCTIONS   Get a gynecology exam and Pap test once every year or as directed by your health care provider.   Get the HPV vaccine.   Do not smoke.  Do not have sexual intercourse until your health care provider says it is okay.  Use a condom every time you have sex. SEEK MEDICAL CARE IF:   You have increased pelvic pain or pressure.   Your are becoming increasingly tired.   You have increased leg or back pain.   You have a fever.  You have abnormal bleeding or discharge.  You lose weight. SEEK IMMEDIATE MEDICAL CARE IF:   You cannot urinate.  You have blood in your urine.   You have blood or pressure with a bowel movement.   You develop severe back, stomach, or pelvic pain.   This information is not intended to replace advice given to you by your health care provider. Make sure you discuss any questions you have with your health care provider.   Document Released: 11/06/2005 Document Revised: 11/11/2013 Document Reviewed: 04/30/2013 Elsevier Interactive Patient Education [employer].

## 2016-08-09 NOTE — Progress Notes (Signed)
Patient ID: Sheila Nunez, female   DOB: 1975/12/30, 40 y.o.   MRN: [id number]     Patient ID: Sheila Nunez, female  DOB: [birth year], 40 y.o.   MRN: [id number] Patient Care Team    Relationship Specialty Notifications Start End  Ma Hillock, DO PCP - General Family Medicine  07/19/15   Tiajuana Amass, MD Referring Physician Allergy and Immunology  05/25/16     Subjective:  Sheila Nunez is a 40 y.o.  Female  present for routine gynecological exam with cervical cancer screen.  All past medical history, surgical history, allergies, family history, immunizations, medications and social history were updated in the electronic medical record today. All recent labs, ED visits and hospitalizations within the last year were reviewed.  Well women: Menstrual cycles 21-30 days, her menses are not heavy, last about 5 days. No vaginal discharge or irritation. No concerns or complaints today gynecologically. She performs SBE exams when she remembers.   Ear pressure: She does complain of ear pressure of both ears today, as well as PND. She is taking xyzal and floanse daily and has an allergists with an appt coming up.   Health maintenance:  Mammogram: No fhx. Screen at age 86.  Cervical cancer screening: 04/07/2013. last pap, results: normal. Immunizations: tdap 06/2014, Influenza (encouraged yearly) Infectious disease screening: HIV completed   Depression screen [facility] 2/9 05/25/2016  Decreased Interest 0  Down, Depressed, Hopeless 0  PHQ - 2 Score 0    Immunization History  Administered Date(s) Administered  . Tdap 07/14/2014     Past Medical History:  Diagnosis Date  . Allergy    Dr. Allyson Sabal  . Depression   . Eczema    Dr. Allyson Sabal   Allergies  Allergen Reactions  . Zithromax [Azithromycin] Hives   Past Surgical History:  Procedure Laterality Date  . CYST EXCISION     eyelids, Dr. Gershon Crane   Family History  Problem Relation Age of Onset  . GER disease Brother   . Cancer Neg Hx     Social History   Social History  . Marital status: Single    Spouse name: N/A  . Number of children: N/A  . Years of education: N/A   Occupational History  . Not on file.   Social History Main Topics  . Smoking status: Never Smoker  . Smokeless tobacco: Never Used  . Alcohol use 1.2 oz/week    2 Glasses of wine per week  . Drug use: No  . Sexual activity: Yes    Partners: Male   Other Topics Concern  . Not on file   Social History Narrative   Single. 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   Never smoker, occ etoh, no drugs.            Medication List       Accurate as of 08/09/16  9:12 AM. Always use your most recent med list.          fluticasone 50 MCG/ACT nasal spray Commonly known as:  FLONASE Place 2 sprays into both nostrils daily.   levocetirizine 5 MG tablet Commonly known as:  XYZAL Take 1 tablet (5 mg total) by mouth every evening.        Recent Results (from the past 2160 hour(s))  CBC w/Diff     Status: None   Collection Time: 05/22/16  8:04 AM  Result  Value Ref Range   WBC 6.5 4.0 - 10.5 K/uL   RBC 4.88 3.87 - 5.11 Mil/uL   Hemoglobin 13.9 12.0 - 15.0 g/dL   HCT 41.7 36.0 - 46.0 %   MCV 85.5 78.0 - 100.0 fl   MCHC 33.4 30.0 - 36.0 g/dL   RDW 13.7 11.5 - 15.5 %   Platelets 189.0 150.0 - 400.0 K/uL   Neutrophils Relative % 61.9 43.0 - 77.0 %   Lymphocytes Relative 30.9 12.0 - 46.0 %   Monocytes Relative 5.5 3.0 - 12.0 %   Eosinophils Relative 1.0 0.0 - 5.0 %   Basophils Relative 0.7 0.0 - 3.0 %   Neutro Abs 4.0 1.4 - 7.7 K/uL   Lymphs Abs 2.0 0.7 - 4.0 K/uL   Monocytes Absolute 0.4 0.1 - 1.0 K/uL   Eosinophils Absolute 0.1 0.0 - 0.7 K/uL   Basophils Absolute 0.0 0.0 - 0.1 K/uL  Comp Met (CMET)     Status: None   Collection Time: 05/22/16  8:04 AM  Result Value Ref Range   Sodium 137 135 - 145 mEq/L   Potassium 4.4 3.5 - 5.1 mEq/L   Chloride 104 96  - 112 mEq/L   CO2 27 19 - 32 mEq/L   Glucose, Bld 99 70 - 99 mg/dL   BUN 11 6 - 23 mg/dL   Creatinine, Ser 0.79 0.40 - 1.20 mg/dL   Total Bilirubin 0.4 0.2 - 1.2 mg/dL   Alkaline Phosphatase 71 39 - 117 U/L   AST 11 0 - 37 U/L   ALT 11 0 - 35 U/L   Total Protein 7.1 6.0 - 8.3 g/dL   Albumin 4.0 3.5 - 5.2 g/dL   Calcium 9.2 8.4 - 10.5 mg/dL   GFR 103.90 >60.00 mL/min  TSH     Status: None   Collection Time: 05/22/16  8:04 AM  Result Value Ref Range   TSH 2.34 0.35 - 4.50 uIU/mL  Lipid Profile     Status: Abnormal   Collection Time: 05/22/16  8:04 AM  Result Value Ref Range   Cholesterol 174 0 - 200 mg/dL    Comment: ATP III Classification       Desirable:  < 200 mg/dL               Borderline High:  200 - 239 mg/dL          High:  > = 240 mg/dL   Triglycerides 43.0 0.0 - 149.0 mg/dL    Comment: Normal:  <150 mg/dLBorderline High:  150 - 199 mg/dL   HDL 49.30 >39.00 mg/dL   VLDL 8.6 0.0 - 40.0 mg/dL   LDL Cholesterol 116 (H) 0 - 99 mg/dL   Total CHOL/HDL Ratio 4     Comment:                Men          Women1/2 Average Risk     3.4          3.3Average Risk          5.0          4.42X Average Risk          9.6          7.13X Average Risk          15.0          11.0  NonHDL 124.52     Comment: NOTE:  Non-HDL goal should be 30 mg/dL higher than patient's LDL goal (i.e. LDL goal of < 70 mg/dL, would have non-HDL goal of < 100 mg/dL)  Vitamin D (25 hydroxy)     Status: Abnormal   Collection Time: 05/22/16  8:04 AM  Result Value Ref Range   VITD 18.83 (L) 30.00 - 100.00 ng/mL  VITAMIN D 25 Hydroxy (Vit-D Deficiency, Fractures)     Status: None   Collection Time: 08/03/16  8:25 AM  Result Value Ref Range   VITD 30.90 30.00 - 100.00 ng/mL      ROS: 14 pt review of systems performed and negative (unless mentioned in an HPI)  Objective: BP 105/74 (BP Location: Left Arm, Patient Position: Sitting, Cuff Size: Large)   Pulse 80   Temp 99.2 F (37.3 C)   Resp 20    Ht 5' 2" (1.575 m)   Wt 193 lb 12.8 oz (87.9 kg)   LMP 07/22/2016 (Exact Date)   SpO2 98%   BMI 35.45 kg/m  Gen: Afebrile. No acute distress. Nontoxic in appearance, well-developed, well-nourished,  Pleasant AAF HENT: AT. [place]. Bilateral TM visualized and normal in appearance, normal external auditory canal. MMM, no oral lesions, adequate dentition. Mild Cough on exam, no hoarseness on exam. Eyes:Pupils Equal Round Reactive to light, Extraocular movements intact,  Conjunctiva without redness, discharge or icterus. Abd: Soft. round. NTND. BS present. no Masses palpated.  Skin: no rashes, purpura or petechiae.  Neuro/Msk:  Normal gait. PERLA. EOMi. Alert. Oriented x3.   Psych: Normal affect, dress and demeanor. Normal speech. Normal thought content and judgment. Breasts: breasts appear normal, symmetrical, mild bilateral  tenderness on exam, no suspicious masses, no skin or nipple changes or axillary nodes. Dense fibrocystic breast lateral breast. GYN:  External genitalia within normal limits, normal hair distribution, no lesions. Urethral meatus normal, no lesions. Vaginal mucosa pink, moist, normal rugae, no lesions. No cystocele or rectocele. cervix without lesions, no discharge. Bimanual exam revealed normal uterus.  No bladder/suprapubic fullness, masses or tenderness. No cervical motion tenderness. No adnexal fullness. Anus and perineum within normal limits, no lesions.  Assessment/plan: KIMYETTA FLOTT is a 40 y.o. female present for CPE. Vitamin D deficiency - repeat vit D 30 after supplement  - Continue 2000 iu - monitor yearly.   Screening for cervical cancer/Encounter for routine gynecological examination - Cytology - PAP with HPV - safe sex/birth control measures discussed.  - SBE encouraged monthly. - AVS provided.   Seasonal allergies - continue to follow with allergist. Appt scheduled.  - ear pressure is allergy related. May consider switching allergy regimen. - Add  mucinex next few days.  - F/U PRN  Health maintenance: flu shot declined today.   > 25 minutes spent with patient, >50% of time spent face to face counseling patient and coordinating care.   Return in about 1 year (around 08/09/2017) for CPE.  Electronically signed by: Howard Pouch, DO [place]

## 2016-08-10 ENCOUNTER — Telehealth: Payer: Self-pay | Admitting: Family Medicine

## 2016-08-10 LAB — CYTOLOGY - PAP

## 2016-08-10 NOTE — Telephone Encounter (Signed)
Please call pt: - her PAP was normal.  - repeat 3 years.

## 2016-08-11 NOTE — Telephone Encounter (Signed)
Spoke with patient reviewed lab results.

## 2016-10-04 DIAGNOSIS — J3089 Other allergic rhinitis: Secondary | ICD-10-CM | POA: Diagnosis not present

## 2016-10-04 DIAGNOSIS — L209 Atopic dermatitis, unspecified: Secondary | ICD-10-CM | POA: Diagnosis not present

## 2016-10-04 DIAGNOSIS — J3081 Allergic rhinitis due to animal (cat) (dog) hair and dander: Secondary | ICD-10-CM | POA: Diagnosis not present

## 2016-12-29 ENCOUNTER — Encounter: Payer: Self-pay | Admitting: Family Medicine

## 2016-12-29 ENCOUNTER — Ambulatory Visit (INDEPENDENT_AMBULATORY_CARE_PROVIDER_SITE_OTHER): Payer: BLUE CROSS/BLUE SHIELD | Admitting: Family Medicine

## 2016-12-29 ENCOUNTER — Telehealth: Payer: Self-pay | Admitting: *Deleted

## 2016-12-29 VITALS — BP 120/83 | HR 65 | Temp 98.3°F | Resp 20 | Wt 196.0 lb

## 2016-12-29 DIAGNOSIS — H18893 Other specified disorders of cornea, bilateral: Secondary | ICD-10-CM | POA: Diagnosis not present

## 2016-12-29 NOTE — Patient Instructions (Signed)
I am uncertain the cause of your eye irritation. It sounds like an allergic reaction.  Since it is resolved and your exam is not concerning today, I would suggest monitoring with caution. If you experience the symptoms again I would be seen urgently while having symptoms.  If your vision changes, you experience eye pain this is an emergency.    Glaucoma Introduction Glaucoma happens when the fluid pressure in the eyeball is too high. If the pressure stays high for too long, the eye may become damaged. This can cause a loss of vision. The most common type of glaucoma causes pressure in the eye to go up slowly. There may be no symptoms at first. Testing for this condition can help to find the condition before damage occurs. Early treatment can often stop vision loss. Follow these instructions at home:  Take medicines only as told by your doctor.  Use your eye drops exactly as told. You will probably need to use these for the rest of your life.  Exercise often. Talk with your doctor about which types of exercise are safe for you. Avoid standing on your head.  Keep all follow-up visits as told by your doctor. This is important. Contact a doctor if:  Your symptoms get worse. Get help right away if:  You have bad pain in your eye.  You have vision problems.  You have a bad headache in the area around your eye.  You feel sick to your stomach (nauseous) or you throw up (vomit).  You start to have problems with your other eye. This information is not intended to replace advice given to you by your health care provider. Make sure you discuss any questions you have with your health care provider. Document Released: 08/15/2008 Document Revised: 04/13/2016 Document Reviewed: 08/18/2014  2017 Elsevier   Uveitis Uveitis is the swelling and irritation in the eye. Most of the time, it affects the middle part of the eye (uvea). There are different types uveitis:  Iritis. This type affects the  colored part of the eye.  Intermediate uveitis. This type affects the middle of the eye.  Posterior uveitis. This type affects the back of the eye.  Panuveitis. This type affects all layers of the eye. Follow these instructions at home:  Take medicines only as told by your doctor. These include over-the-counter medicines and prescription medicines.  Follow instructions from your doctor about what activities are safe for you.  Do not use any tobacco products. These include cigarettes, chewing tobacco, and e-cigarettes. If you need help quitting, ask your doctor.  Keep all follow-up visits as told by your doctor. This is important. Contact a doctor if:  Your medicines are not working. Get help right away if:  You have redness in one eye or both eyes.  Light bothers your eyes a lot.  You have pain in your eye.  You have aching in your eye.  You cannot see as much as you did before. This information is not intended to replace advice given to you by your health care provider. Make sure you discuss any questions you have with your health care provider. Document Released: 03/23/2015 Document Revised: 07/09/2016 Document Reviewed: 11/11/2014 Elsevier Interactive Patient Education  2017 [employer].

## 2016-12-29 NOTE — Telephone Encounter (Signed)
PLEASE NOTE: All timestamps contained within this report are represented as [place] Standard Time. CONFIDENTIALTY NOTICE: This fax transmission is intended only for the addressee. It contains information that is legally privileged, confidential or otherwise protected from use or disclosure. If you are not the intended recipient, you are strictly prohibited from reviewing, disclosing, copying using or disseminating any of this information or taking any action in reliance on or regarding this information. If you have received this fax in error, please notify us immediately by telephone so that we can arrange for its return to [place]. Phone: [phone], Toll-Free: [phone], Fax: [phone] Page: 1 of 2 Call Id: [id number] Clarinda Patient Name: Sheila Nunez Gender: Female DOB: Apr 04, 1976 Age: 41 Y 1 M 27 D Return Phone Number: [phone] (Primary) City/State/Zip: [place] [address] Client [place] [facility] - Client Client Site [facility] [facility] Physician Howard Pouch Who Is Calling Patient / Member / Family / Caregiver Call Type Triage / Clinical Relationship To Patient Self Return Phone Number [phone] (Primary) Chief Complaint Health information question (non symptomatic) Reason for Call Symptomatic / Request for [facility] [place] states that she needs direction about prepping for an upcoming appt in the morning Nurse Assessment Nurse: Christel Mormon, RN, [place] Dy Date/Time (Eastern Time): 12/28/2016 6:02:49 PM Confirm and document reason for call. If symptomatic, describe symptoms. ---Caller states her eyes started burning today around lunch or just after lunch. She had a headache at 8am- mild and did not take anything for it. She bought some solution and rinsed her eyes out so they're not burning as bad- [facility] "[place]". She still has a slight headache. She is more concerned about the eyes "I can forget about the headache". The eyes were a little red earlier but not much now. Does the PT have any chronic conditions? (i.e. diabetes, asthma, etc.) ---Yes List chronic conditions. ---allergies Is the patient pregnant or possibly pregnant? (Ask all females between the ages of 54-55) ---No Guidelines Guideline Title Affirmed Question Eye Pain [1] Mild eye pain caused by sunscreen, smoke, smog, chlorine, food, soap or other mild irritant AND [2] no blurred vision (all triage questions negative) Disp. Time Eilene Ghazi Time) Disposition Final User 12/28/2016 6:15:02 Dixon, RN, [facility] Advice Given Per Guideline HOME CARE: You should be able to treat this at home. REASSURANCE: * There are a number of substances that can irritate the eyes and cause temporary redness. Examples include sunscreen, smoke, smog, chlorine, perfume, food, soap. * You have told me that the pain is mild and that you have had no worsening of your vision. This is important and reassuring. * The two most common symptoms of a serious eye problem are: worsening pain and worsening vision. * Here is some care advice that may help. EYE IRRIGATION: Flush the eye with warm water for 1-2 minutes. AVOID RUBBING: * Rubbing your eyes irritates them more. Rubbing too hard can cause a corneal abrasion. VASOCONSTRICTOR EYE DROPS: * Red eyes from irritants usually feel much better after the irritant has been washed out. * If they remain uncomfortable and bloodshot, you can use some over-the-counter (OTC) vasoconstrictor eye drops (e.g., Visine). Use 1 to 2 drops. May repeat once in 8-12 hours. CAUTION - VASOCONSTRICTOR EYE DROPS: EXPECTED COURSE: After removal of the irritant, your eyes should return to normal within a couple hours. *  Do not use PLEASE NOTE: All timestamps contained within this report are represented as [place] Standard  Time. CONFIDENTIALTY NOTICE: This fax transmission is intended only for the addressee. It contains information that is legally privileged, confidential or otherwise protected from use or disclosure. If you are not the intended recipient, you are strictly prohibited from reviewing, disclosing, copying using or disseminating any of this information or taking any action in reliance on or regarding this information. If you have received this fax in error, please notify us immediately by telephone so that we can arrange for its return to [place]. Phone: [phone], Toll-Free: [phone], Fax: [phone] Page: 2 of 2 Call Id: [id number] Care Advice Given Per Guideline if you have glaucoma or serious eye diseases. * Do not use while wearing contact lenses. CALL BACK IF: * Pain increases * Pain persists over 24 hours * Pus or yellow/green discharge occurs * Blurred vision occurs * You become worse. CARE ADVICE given per Eye Pain (Adult) guideline. Comments User: Donnie Aho, RN Date/Time Eilene Ghazi Time): 12/28/2016 6:15:24 PM she has an appt for 8:45am tomorrow

## 2016-12-29 NOTE — Progress Notes (Signed)
Sheila Nunez , Oct 04, 1976, 41 y.o., female MRN: [id number] Patient Care Team    Relationship Specialty Notifications Start End  Ma Hillock, DO PCP - General Family Medicine  07/19/15   Tiajuana Amass, MD Referring Physician Allergy and Immunology  05/25/16     CC: headache/eye irritation Subjective: Pt presents for an OV with complaints of bilateral eye irritation and mild headache of 1 day duration.  Associated symptoms include mild blurred vision. Patient states she was at work yesterday and her eye started burning and she had a very mild headache. She then noticed both of her eyes were mildly red. She rinsed her eyes out with saline solution and it mildly improved. She states the symptoms self resolved once she was home. She does not have current symptoms. She has never experienced similar symptoms. She has experienced many different types of allergic skin reactions in the past. She denies FH glaucoma. She does not wear contacts or glasses. She denies fever, chills, nausea, vomit, diarrhea, skin rash, photophobia, eye discharge/watery or eye pain.    Allergies  Allergen Reactions  . Zithromax [Azithromycin] Hives   Social History  Substance Use Topics  . Smoking status: Never Smoker  . Smokeless tobacco: Never Used  . Alcohol use 1.2 oz/week    2 Glasses of wine per week   Past Medical History:  Diagnosis Date  . Allergy    Dr. Allyson Sabal  . Depression   . Eczema    Dr. Allyson Sabal   Past Surgical History:  Procedure Laterality Date  . CYST EXCISION     eyelids, Dr. Gershon Crane   Family History  Problem Relation Age of Onset  . GER disease Brother   . Cancer Neg Hx    Allergies as of 12/29/2016      Reactions   Zithromax [azithromycin] Hives      Medication List       Accurate as of 12/29/16  8:51 AM. Always use your most recent med list.          fluticasone 50 MCG/ACT nasal spray Commonly known as:  FLONASE Place 2 sprays into both nostrils daily.   levocetirizine 5  MG tablet Commonly known as:  XYZAL Take 1 tablet (5 mg total) by mouth every evening.       No results found for this or any previous visit (from the past 24 hour(s)). No results found.   ROS: Negative, with the exception of above mentioned in HPI   Objective:  BP 120/83 (BP Location: Right Arm, Patient Position: Sitting, Cuff Size: Large)   Pulse 65   Temp 98.3 F (36.8 C)   Resp 20   Wt 196 lb (88.9 kg)   SpO2 99%   BMI 35.85 kg/m  Body mass index is 35.85 kg/m. Gen: Afebrile. No acute distress. Nontoxic in appearance, well developed, well nourished.  HENT: AT. [place]. MMM, no oral lesions. Bilateral nares wnl. Throat without erythema or exudates.  Eyes:Pupils Equal Round Reactive to light, Extraocular movements intact,  Conjunctiva without redness, discharge or icterus. No globe pain with pressure.  Neck/lymp/endocrine: Supple,no lymphadenopathy  Assessment/Plan: Sheila Nunez is a 41 y.o. female present for acute OV for  Corneal irritation of both eyes - unknown etiology at this time. Currently asymptomatic. Discussed DDX of allergic reaction vs viral vs emergent eye condition (gluacoma, uvetitis etc). No FHX.  - favor allergic reaction (especially since bilateral and resolved after leaving work), possibly  something at work. Pt is to  monitor and see if symptoms return today once there.  - Pt was given AVS information on the emergent possibilities and she experiences any red flags she is to be seen immediately.  - continue xyzal and flonase.  - F/U PRN   electronically signed by:  Howard Pouch, DO  [place]

## 2017-03-15 ENCOUNTER — Encounter: Payer: Self-pay | Admitting: Podiatry

## 2017-03-15 ENCOUNTER — Ambulatory Visit (INDEPENDENT_AMBULATORY_CARE_PROVIDER_SITE_OTHER): Payer: BLUE CROSS/BLUE SHIELD | Admitting: Podiatry

## 2017-03-15 ENCOUNTER — Ambulatory Visit (INDEPENDENT_AMBULATORY_CARE_PROVIDER_SITE_OTHER): Payer: BLUE CROSS/BLUE SHIELD

## 2017-03-15 VITALS — Resp 16 | Ht 62.0 in

## 2017-03-15 DIAGNOSIS — M2010 Hallux valgus (acquired), unspecified foot: Secondary | ICD-10-CM

## 2017-03-15 DIAGNOSIS — D169 Benign neoplasm of bone and articular cartilage, unspecified: Secondary | ICD-10-CM | POA: Diagnosis not present

## 2017-03-15 NOTE — Progress Notes (Signed)
   Subjective:    Patient ID: Sheila Nunez, female    DOB: December 21, 1975, 41 y.o.   MRN: [id number]  HPI Chief Complaint  Patient presents with  . Foot Pain    Bilateral; bunions; pt stated, "Right foot is worse than the left foot; wants to have both feet looked at; not interested in surgery at this time"      Review of Systems  Allergic/Immunologic: Positive for environmental allergies.  All other systems reviewed and are negative.      Objective:   Physical Exam        Assessment & Plan:

## 2017-03-15 NOTE — Progress Notes (Signed)
Subjective:    Patient ID: Sheila Nunez, female   DOB: 41 y.o.   MRN: [id number]   HPI patient presents stating she's concerned about bunion deformity right over left and has pain on top of the right foot when she wears tighter shoes. States that she's not sure about family history but the problem has been present for a while and seems to bother her more as time goes on    Review of Systems  All other systems reviewed and are negative.       Objective:  Physical Exam  Constitutional: She is oriented to person, place, and time.  Cardiovascular: Intact distal pulses.   Musculoskeletal: Normal range of motion.  Neurological: She is alert and oriented to person, place, and time.  Skin: Skin is warm.  Nursing note and vitals reviewed.  neurological sensation found to be intact with muscle strength adequate range of motion within normal limits. Patient's found to have hyperostosis medial aspect first metatarsal head right over left with slight redness around the joint and moderate prominence the metatarsocuneiform joint first metatarsal right foot. 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 Today I went ahead and I recommended wider shoes lacing them differently and the possibility for surgical intervention one point in future area patient be seen back to recheck again in the next several months or earlier if any issues should occur and she was given all information concerning bunion correction  X-ray indicates there is moderate structural deformity around the first metatarsal head right over left with a moderate saddle bone deformity right

## 2017-03-15 NOTE — Patient Instructions (Addendum)
Bunionectomy A bunionectomy is a surgical procedure to remove a bunion. A bunion is a visible bump of bone on the inside of your foot where your big toe meets the rest of your foot. A bunion can develop when pressure turns this bone (first metatarsal) toward the other toes. Shoes that are too tight are the most common cause of bunions. Bunions can also be caused by diseases, such as arthritis and polio. You may need a bunionectomy if your bunion is very large and painful or it affects your ability to walk. Tell a health care provider about:  Any allergies you have.  All medicines you are taking, including vitamins, herbs, eye drops, creams, and over-the-counter medicines.  Any problems you or family members have had with anesthetic medicines.  Any blood disorders you have.  Any surgeries you have had.  Any medical conditions you have. What are the risks? Generally, this is a safe procedure. However, problems may occur, including:  Infection.  Pain.  Nerve damage.  Bleeding or blood clots.  Reactions to medicines.  Numbness, stiffness, or arthritis in your toe.  Foot problems that continue even after the procedure. What happens before the procedure?  Ask your health care provider about:  Changing or stopping your regular medicines. This is especially important if you are taking diabetes medicines or blood thinners.  Taking medicines such as aspirin and ibuprofen. These medicines can thin your blood. Do not take these medicines before your procedure if your health care provider instructs you not to.  Do not drink alcohol before the procedure as directed by your health care provider.  Do not use tobacco products, including cigarettes, chewing tobacco, or electronic cigarettes, before the procedure as directed by your health care provider. If you need help quitting, ask your health care provider.  Ask your health care provider what kind of medicine you will be given during  your procedure. A bunionectomy may be done using one of these:  A medicine that numbs the area (local anesthetic).  A medicine that makes you go to sleep (general anesthetic). If you will be given general anesthetic, do not eat or drink anything after midnight on the night before the procedure or as directed by your health care provider. What happens during the procedure?  An IV tube may be inserted into a vein.  You will be given local anesthetic or general anesthetic.  The surgeon will make a cut (incision) over the enlarged area at the first joint of the big toe. The surgeon will remove the bunion.  You may have more than one incision if any of the bones in your big toe need to be moved. A bone itself may need to be cut.  Sometimes the tissues around the big toe may also need to be cut then tightened or loosened to reposition the toe.  Screws or other hardware may be used to keep your foot in thecorrect position.  The incision will be closed with stitches (sutures) and covered with adhesive strips or another type of bandage (dressing). What happens after the procedure?  You may spend some time in a recovery area.  Your blood pressure, heart rate, breathing rate, and blood oxygen level will be monitored often until the medicines you were given have worn off. This information is not intended to replace advice given to you by your health care provider. Make sure you discuss any questions you have with your health care provider. Document Released: 10/20/2005 Document Revised: 04/13/2016 Document Reviewed: 06/24/2014  Elsevier Interactive Patient Education  2017 [place] A bunionectomy is a surgical procedure to remove a bunion. A bunion is a visible bump of bone on the inside of your foot where your big toe meets the rest of your foot. A bunion can develop when pressure turns this bone (first metatarsal) toward the other toes. Shoes that are too tight are the most  common cause of bunions. Bunions can also be caused by diseases, such as arthritis and polio. You may need a bunionectomy if your bunion is very large and painful or it affects your ability to walk. Tell a health care provider about:  Any allergies you have.  All medicines you are taking, including vitamins, herbs, eye drops, creams, and over-the-counter medicines.  Any problems you or family members have had with anesthetic medicines.  Any blood disorders you have.  Any surgeries you have had.  Any medical conditions you have. What are the risks? Generally, this is a safe procedure. However, problems may occur, including:  Infection.  Pain.  Nerve damage.  Bleeding or blood clots.  Reactions to medicines.  Numbness, stiffness, or arthritis in your toe.  Foot problems that continue even after the procedure. What happens before the procedure?  Ask your health care provider about:  Changing or stopping your regular medicines. This is especially important if you are taking diabetes medicines or blood thinners.  Taking medicines such as aspirin and ibuprofen. These medicines can thin your blood. Do not take these medicines before your procedure if your health care provider instructs you not to.  Do not drink alcohol before the procedure as directed by your health care provider.  Do not use tobacco products, including cigarettes, chewing tobacco, or electronic cigarettes, before the procedure as directed by your health care provider. If you need help quitting, ask your health care provider.  Ask your health care provider what kind of medicine you will be given during your procedure. A bunionectomy may be done using one of these:  A medicine that numbs the area (local anesthetic).  A medicine that makes you go to sleep (general anesthetic). If you will be given general anesthetic, do not eat or drink anything after midnight on the night before the procedure or as directed by  your health care provider. What happens during the procedure?  An IV tube may be inserted into a vein.  You will be given local anesthetic or general anesthetic.  The surgeon will make a cut (incision) over the enlarged area at the first joint of the big toe. The surgeon will remove the bunion.  You may have more than one incision if any of the bones in your big toe need to be moved. A bone itself may need to be cut.  Sometimes the tissues around the big toe may also need to be cut then tightened or loosened to reposition the toe.  Screws or other hardware may be used to keep your foot in thecorrect position.  The incision will be closed with stitches (sutures) and covered with adhesive strips or another type of bandage (dressing). What happens after the procedure?  You may spend some time in a recovery area.  Your blood pressure, heart rate, breathing rate, and blood oxygen level will be monitored often until the medicines you were given have worn off. This information is not intended to replace advice given to you by your health care provider. Make sure you discuss any questions you have with your health care  provider. Document Released: 10/20/2005 Document Revised: 04/13/2016 Document Reviewed: 06/24/2014 Elsevier Interactive Patient Education  2017 [employer].

## 2017-06-07 ENCOUNTER — Encounter: Payer: Self-pay | Admitting: Family Medicine

## 2017-06-07 ENCOUNTER — Ambulatory Visit (INDEPENDENT_AMBULATORY_CARE_PROVIDER_SITE_OTHER): Payer: BLUE CROSS/BLUE SHIELD | Admitting: Family Medicine

## 2017-06-07 VITALS — BP 98/66 | HR 58 | Temp 98.5°F | Resp 20 | Ht 62.0 in | Wt 186.2 lb

## 2017-06-07 DIAGNOSIS — Z13220 Encounter for screening for lipoid disorders: Secondary | ICD-10-CM | POA: Diagnosis not present

## 2017-06-07 DIAGNOSIS — Z131 Encounter for screening for diabetes mellitus: Secondary | ICD-10-CM | POA: Diagnosis not present

## 2017-06-07 DIAGNOSIS — Z1231 Encounter for screening mammogram for malignant neoplasm of breast: Secondary | ICD-10-CM

## 2017-06-07 DIAGNOSIS — E669 Obesity, unspecified: Secondary | ICD-10-CM | POA: Insufficient documentation

## 2017-06-07 DIAGNOSIS — Z6834 Body mass index (BMI) 34.0-34.9, adult: Secondary | ICD-10-CM

## 2017-06-07 DIAGNOSIS — Z1239 Encounter for other screening for malignant neoplasm of breast: Secondary | ICD-10-CM

## 2017-06-07 DIAGNOSIS — E559 Vitamin D deficiency, unspecified: Secondary | ICD-10-CM

## 2017-06-07 DIAGNOSIS — Z130 Encounter for screening for diseases of the blood and blood-forming organs and certain disorders involving the immune mechanism: Secondary | ICD-10-CM | POA: Diagnosis not present

## 2017-06-07 DIAGNOSIS — Z0000 Encounter for general adult medical examination without abnormal findings: Secondary | ICD-10-CM

## 2017-06-07 LAB — LIPID PANEL
CHOLESTEROL: 175 mg/dL (ref 0–200)
HDL: 49.9 mg/dL (ref >39.00)
LDL Cholesterol: 114 mg/dL — ABNORMAL HIGH (ref 0–99)
NonHDL: 125.01
TRIGLYCERIDES: 56 mg/dL (ref 0.0–149.0)
Total CHOL/HDL Ratio: 4
VLDL: 11.2 mg/dL (ref 0.0–40.0)

## 2017-06-07 LAB — CBC WITH DIFFERENTIAL/PLATELET
BASOS PCT: 0.2 % (ref 0.0–3.0)
Basophils Absolute: 0 10*3/uL (ref 0.0–0.1)
EOS PCT: 1.2 % (ref 0.0–5.0)
Eosinophils Absolute: 0.1 10*3/uL (ref 0.0–0.7)
HEMATOCRIT: 43.2 % (ref 36.0–46.0)
HEMOGLOBIN: 14.5 g/dL (ref 12.0–15.0)
LYMPHS PCT: 32.4 % (ref 12.0–46.0)
Lymphs Abs: 2.1 10*3/uL (ref 0.7–4.0)
MCHC: 33.6 g/dL (ref 30.0–36.0)
MCV: 89.3 fl (ref 78.0–100.0)
MONO ABS: 0.4 10*3/uL (ref 0.1–1.0)
MONOS PCT: 6 % (ref 3.0–12.0)
Neutro Abs: 3.8 10*3/uL (ref 1.4–7.7)
Neutrophils Relative %: 60.2 % (ref 43.0–77.0)
Platelets: 205 10*3/uL (ref 150.0–400.0)
RBC: 4.84 Mil/uL (ref 3.87–5.11)
RDW: 13.1 % (ref 11.5–15.5)
WBC: 6.4 10*3/uL (ref 4.0–10.5)

## 2017-06-07 LAB — COMPREHENSIVE METABOLIC PANEL
ALBUMIN: 4.2 g/dL (ref 3.5–5.2)
ALK PHOS: 69 U/L (ref 39–117)
ALT: 11 U/L (ref 0–35)
AST: 11 U/L (ref 0–37)
BUN: 9 mg/dL (ref 6–23)
CALCIUM: 9.4 mg/dL (ref 8.4–10.5)
CO2: 31 mEq/L (ref 19–32)
Chloride: 103 mEq/L (ref 96–112)
Creatinine, Ser: 0.8 mg/dL (ref 0.40–1.20)
GFR: 101.86 mL/min (ref >60.00)
Glucose, Bld: 91 mg/dL (ref 70–99)
POTASSIUM: 4.5 meq/L (ref 3.5–5.1)
SODIUM: 137 meq/L (ref 135–145)
TOTAL PROTEIN: 7.1 g/dL (ref 6.0–8.3)
Total Bilirubin: 0.8 mg/dL (ref 0.2–1.2)

## 2017-06-07 LAB — TSH: TSH: 1.12 u[IU]/mL (ref 0.35–4.50)

## 2017-06-07 LAB — VITAMIN D 25 HYDROXY (VIT D DEFICIENCY, FRACTURES): VITD: 16.85 ng/mL — AB (ref 30.00–100.00)

## 2017-06-07 LAB — HEMOGLOBIN A1C: Hgb A1c MFr Bld: 5.3 % (ref 4.6–6.5)

## 2017-06-07 NOTE — Progress Notes (Signed)
Patient ID: Sheila Nunez, female  DOB: [birth year], 41 y.o.   MRN: [id number] Patient Care Team    Relationship Specialty Notifications Start End  Ma Hillock, DO PCP - General Family Medicine  07/19/15   Tiajuana Amass, MD Referring Physician Allergy and Immunology  05/25/16     Chief Complaint  Patient presents with  . Annual Exam    Subjective:  Sheila Nunez is a 41 y.o.  Female  present for CPE. All past medical history, surgical history, allergies, family history, immunizations, medications and social history were updated in the electronic medical record today. All recent labs, ED visits and hospitalizations within the last year were reviewed.  Well women: Menstrual cycles approximately every 30 days, her menses still are not heavy, last about 5 days or less.  She denies vaginal discharge or irritiation. No concerns or complaints today gynecologically. She does not routinely  performs SBE exams. Patient's last menstrual period was 05/16/2017.   Health maintenance:  Colonoscopy: No Fhx screen at 50 Mammogram: No fhx. Screen at age 43. Ordered today. Cervical cancer screening: 08/09/2016. last pap completed here, results: normal/neg co test. 3-5 year rpt. Immunizations: tdap 06/2014, Influenza --> allergic Infectious disease screening: HIV completed 2014 DEXA: N/A Assistive device: None Oxygen YTK:ZSWF Patient has a Dental home. Hospitalizations/ED visits: None    Depression screen [facility] 2/9 06/07/2017 05/25/2016  Decreased Interest 0 0  Down, Depressed, Hopeless 0 0  PHQ - 2 Score 0 0   No flowsheet data found.   Current Exercise Habits: Home exercise routine;Structured exercise class, Time (Minutes): 60, Frequency (Times/Week): 4, Weekly Exercise (Minutes/Week): 240, Intensity: Moderate     Immunization History  Administered Date(s) Administered  . Tdap 07/14/2014     Past Medical History:  Diagnosis Date  . Allergy    Dr. Allyson Sabal  . Depression   .  Eczema    Dr. Allyson Sabal   Allergies  Allergen Reactions  . Zithromax [Azithromycin] Hives   Past Surgical History:  Procedure Laterality Date  . CYST EXCISION     eyelids, Dr. Gershon Crane   Family History  Problem Relation Age of Onset  . GER disease Brother   . Cancer Neg Hx    Social History   Social History  . Marital status: Single    Spouse name: N/A  . Number of children: N/A  . Years of education: N/A   Occupational History  . Not on file.   Social History Main Topics  . Smoking status: Never Smoker  . Smokeless tobacco: Never Used  . Alcohol use 1.2 oz/week    2 Glasses of wine per week  . Drug use: No  . Sexual activity: Yes    Partners: Male   Other Topics Concern  . Not on file   Social History Narrative   Single. 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   Never smoker, occ etoh, no drugs.          Allergies as of 06/07/2017      Reactions   Zithromax [azithromycin] Hives      Medication List       Accurate as of 06/07/17  9:31 AM. Always use your most recent med list.          azelastine 0.05 % ophthalmic solution Commonly known as:  OPTIVAR   fluticasone 50 MCG/ACT nasal spray Commonly known as:  FLONASE Place 2 sprays into both nostrils daily.   levocetirizine 5 MG tablet Commonly known as:  XYZAL Take 1 tablet (5 mg total) by mouth every evening.       All past medical history, surgical history, allergies, family history, immunizations andmedications were updated in the EMR today and reviewed under the history and medication portions of their EMR.     No results found for this or any previous visit (from the past 2160 hour(s)).  No results found.   ROS: 14 pt review of systems performed and negative (unless mentioned in an HPI)  Objective: BP 98/66 (BP Location: Right Arm, Patient Position: Sitting, Cuff Size: Large)   Pulse (!) 58   Temp 98.5 F  (36.9 C)   Resp 20   Ht 5' 2"  (1.575 m)   Wt 186 lb 4 oz (84.5 kg)   LMP 05/16/2017   SpO2 98%   BMI 34.07 kg/m  Gen: Afebrile. No acute distress. Nontoxic in appearance, well-developed, well-nourished,  Very pleasant AAF.  HENT: AT. [place]. Bilateral TM visualized and normal in appearance, normal external auditory canal. MMM, no oral lesions, adequate dentition. Bilateral nares within normal limits. Throat without erythema, ulcerations or exudates. no Cough on exam, no hoarseness on exam. Eyes:Pupils Equal Round Reactive to light, Extraocular movements intact,  Conjunctiva without redness, discharge or icterus. Neck/lymp/endocrine: Supple,no lymphadenopathy, no thyromegaly CV: RRR no murmur, no edema, +2/4 P posterior tibialis pulses. no carotid bruits. No JVD. Chest: CTAB, no wheeze, rhonchi or crackles. Normal  Respiratory effort. good Air movement. Abd: Soft. obese. NTND. BS present. no Masses palpated. No hepatosplenomegaly. No rebound tenderness or guarding. Skin: no rashes, purpura or petechiae. Warm and well-perfused. Skin intact. Neuro/Msk:  Normal gait. PERLA. EOMi. Alert. Oriented x3.  Cranial nerves II through XII intact. Muscle strength 5/5 upper/lower extremity. DTRs equal bilaterally. Psych: Normal affect, dress and demeanor. Normal speech. Normal thought content and judgment.   No exam data present  Assessment/plan: Sheila Nunez is a 41 y.o. female present for CPE. Vitamin D deficiency - Vitamin D (25 hydroxy) Screening for diabetes mellitus - HgB A1c Screening for iron deficiency anemia - CBC w/Diff Lipid screening - Lipid panel BMI 34.0-34.9,adult - pt doing rather well with diet and exercise lost 10#, added kick boxing to her routine and enjoys it.  - HgB A1c - TSH Breast cancer screening - MM Digital Screening; Future  Encounter for preventive health examination Patient was encouraged to exercise greater than 150 minutes a week. Patient was encouraged to  choose a diet filled with fresh fruits and vegetables, and lean meats. AVS provided to patient today for education/recommendation on gender specific health and safety maintenance. Mammogram ordered. All other desired screenings and immunizations are UTD.  Encouraged SBE.   Return in about 1 year (around 06/07/2018) for CPE.  Electronically signed by: Howard Pouch, DO [place]

## 2017-06-07 NOTE — Patient Instructions (Signed)

## 2017-06-08 ENCOUNTER — Encounter: Payer: Self-pay | Admitting: *Deleted

## 2017-06-08 ENCOUNTER — Telehealth: Payer: Self-pay | Admitting: Family Medicine

## 2017-06-08 NOTE — Telephone Encounter (Signed)
Please call pt: - her labs are all great, except her Vit d which is rather low at 16.  - I have called in a prescribed supplement for 12 weeks, she should also take 1000 u daily with a meal OTC during and continue daily after supplement finishes.  - We likely do not need to recheck in 12 weeks, since she has proven in the past to be able to absorb vit d  When taken, she just needs to routinely take it.  - If she would like to re-test it in 12 weeks we can surely do so by provider appt and a pre- appt vit d level. (order would need placed)

## 2017-06-08 NOTE — Telephone Encounter (Signed)
Spoke with patient reviewed lab results and instructions I sent the Information in [facility] Chart also. Patient verbalized understanding of all instructions.

## 2017-06-12 ENCOUNTER — Telehealth: Payer: Self-pay | Admitting: *Deleted

## 2017-06-12 DIAGNOSIS — E559 Vitamin D deficiency, unspecified: Secondary | ICD-10-CM

## 2017-06-12 MED ORDER — VITAMIN D (ERGOCALCIFEROL) 1.25 MG (50000 UNIT) PO CAPS: 50000.0000 [IU] | ORAL_CAPSULE | ORAL | 0 refills | Status: DC

## 2017-06-12 NOTE — Telephone Encounter (Signed)
Pt called stating that Rx for Vitamin D 50,000 units was suppose to be sent to her pharmacy. Please send Rx to [facility] in [employer] on [address].

## 2017-06-12 NOTE — Telephone Encounter (Signed)
Sent vit d

## 2017-09-12 ENCOUNTER — Ambulatory Visit
Admission: RE | Admit: 2017-09-12 | Discharge: 2017-09-12 | Disposition: A | Discharge status: Home / Self Care | Payer: BLUE CROSS/BLUE SHIELD | Source: Ambulatory Visit | Attending: Family Medicine | Admitting: Family Medicine

## 2017-09-12 DIAGNOSIS — Z1239 Encounter for other screening for malignant neoplasm of breast: Secondary | ICD-10-CM

## 2017-09-12 DIAGNOSIS — Z1231 Encounter for screening mammogram for malignant neoplasm of breast: Secondary | ICD-10-CM | POA: Diagnosis not present

## 2018-01-01 DIAGNOSIS — H1045 Other chronic allergic conjunctivitis: Secondary | ICD-10-CM | POA: Diagnosis not present

## 2018-01-01 DIAGNOSIS — J3081 Allergic rhinitis due to animal (cat) (dog) hair and dander: Secondary | ICD-10-CM | POA: Diagnosis not present

## 2018-01-01 DIAGNOSIS — J3089 Other allergic rhinitis: Secondary | ICD-10-CM | POA: Diagnosis not present

## 2018-01-01 DIAGNOSIS — L209 Atopic dermatitis, unspecified: Secondary | ICD-10-CM | POA: Diagnosis not present

## 2018-04-26 ENCOUNTER — Encounter: Payer: Self-pay | Admitting: Family Medicine

## 2018-04-26 ENCOUNTER — Ambulatory Visit: Payer: BLUE CROSS/BLUE SHIELD | Admitting: Family Medicine

## 2018-04-26 VITALS — BP 120/87 | HR 68 | Temp 98.1°F | Resp 16 | Ht 62.0 in | Wt 172.0 lb

## 2018-04-26 DIAGNOSIS — R2231 Localized swelling, mass and lump, right upper limb: Secondary | ICD-10-CM | POA: Diagnosis not present

## 2018-04-26 DIAGNOSIS — M25531 Pain in right wrist: Secondary | ICD-10-CM

## 2018-04-26 NOTE — Patient Instructions (Signed)
Try capsaicin cream over cyst.  We will get xray to rule out other potential cause and if normal, sports med referral will be placed for you.  Wear the wrist splint during work.   You can try advil when painful.     Ganglion Cyst A ganglion cyst is a noncancerous, fluid-filled lump that occurs near joints or tendons. The ganglion cyst grows out of a joint or the lining of a tendon. It most often develops in the hand or wrist, but it can also develop in the shoulder, elbow, hip, knee, ankle, or foot. The round or oval ganglion cyst can be the size of a pea or larger than a grape. Increased activity may enlarge the size of the cyst because more fluid starts to build up. What are the causes? It is not known what causes a ganglion cyst to grow. However, it may be related to:  Inflammation or irritation around the joint.  An injury.  Repetitive movements or overuse.  Arthritis.  What increases the risk? Risk factors include:  Being a woman.  Being age 60-50.  What are the signs or symptoms? Symptoms may include:  A lump. This most often appears on the hand or wrist, but it can occur in other areas of the body.  Tingling.  Pain.  Numbness.  Muscle weakness.  Weak grip.  Less movement in a joint.  How is this diagnosed? Ganglion cysts are most often diagnosed based on a physical exam. Your health care provider will feel the lump and may shine a light alongside it. If it is a ganglion cyst, a light often shines through it. Your health care provider may order an X-ray, ultrasound, or MRI to rule out other conditions. How is this treated? Ganglion cysts usually go away on their own without treatment. If pain or other symptoms are involved, treatment may be needed. Treatment is also needed if the ganglion cyst limits your movement or if it gets infected. Treatment may include:  Wearing a brace or splint on your wrist or finger.  Taking anti-inflammatory medicine.  Draining  fluid from the lump with a needle (aspiration).  Injecting a steroid into the joint.  Surgery to remove the ganglion cyst.  Follow these instructions at home:  Do not press on the ganglion cyst, poke it with a needle, or hit it.  Take medicines only as directed by your health care provider.  Wear your brace or splint as directed by your health care provider.  Watch your ganglion cyst for any changes.  Keep all follow-up visits as directed by your health care provider. This is important. Contact a health care provider if:  Your ganglion cyst becomes larger or more painful.  You have increased redness, red streaks, or swelling.  You have pus coming from the lump.  You have weakness or numbness in the affected area.  You have a fever or chills. This information is not intended to replace advice given to you by your health care provider. Make sure you discuss any questions you have with your health care provider. Document Released: 11/03/2000 Document Revised: 04/13/2016 Document Reviewed: 04/21/2014 Elsevier Interactive Patient Education  2018 [employer].

## 2018-04-26 NOTE — Progress Notes (Signed)
Sheila Nunez , 1976-08-11, 42 y.o., female MRN: [id number] Patient Care Team    Relationship Specialty Notifications Start End  Sheila Hillock, DO PCP - General Family Medicine  07/19/15   Sheila Amass, MD Referring Physician Allergy and Immunology  05/25/16     Chief Complaint  Patient presents with  . Cyst    Right wrist     Subjective: Pt presents for an OV with complaints of right wrist pain of 2 weeks duration.  Associated symptoms include mass midline dorsal wrist.  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  She has noticed with flexion of her wrists, push-ups etc. she makes her wrist hurt.  She does not recall a specific injury to her wrist.  She has tried nothing for discomfort.  She has started wearing a wrist splint during the day at work since she also types.  Depression screen [facility] 2/9 06/07/2017 05/25/2016  Decreased Interest 0 0  Down, Depressed, Hopeless 0 0  PHQ - 2 Score 0 0    Allergies  Allergen Reactions  . Zithromax [Azithromycin] Hives   Social History   Tobacco Use  . Smoking status: Never Smoker  . Smokeless tobacco: Never Used  Substance Use Topics  . Alcohol use: Yes    Alcohol/week: 1.2 oz    Types: 2 Glasses of wine per week   Past Medical History:  Diagnosis Date  . Allergy    Dr. Allyson Nunez  . Depression   . Eczema    Dr. Allyson Nunez   Past Surgical History:  Procedure Laterality Date  . CYST EXCISION     eyelids, Dr. Gershon Nunez   Family History  Problem Relation Age of Onset  . GER disease Brother   . Cancer Neg Hx    Allergies as of 04/26/2018      Reactions   Zithromax [azithromycin] Hives      Medication List        Accurate as of 04/26/18  3:52 PM. Always use your most recent med list.          azelastine 0.05 % ophthalmic solution Commonly known as:  OPTIVAR   fluticasone 50 MCG/ACT nasal spray Commonly known as:  FLONASE Place 2  sprays into both nostrils daily.   levocetirizine 5 MG tablet Commonly known as:  XYZAL Take 1 tablet (5 mg total) by mouth every evening.   Vitamin D (Ergocalciferol) 50000 units Caps capsule Commonly known as:  DRISDOL Take 1 capsule (50,000 Units total) by mouth every 7 (seven) days.       All past medical history, surgical history, allergies, family history, immunizations andmedications were updated in the EMR today and reviewed under the history and medication portions of their EMR.     ROS: Negative, with the exception of above mentioned in HPI   Objective:  BP 120/87 (BP Location: Left Arm, Patient Position: Sitting, Cuff Size: Normal)   Pulse 68   Temp 98.1 F (36.7 C) (Oral)   Resp 16   Ht 5' 2"  (1.575 m)   Wt 172 lb (78 kg)   SpO2 98%   BMI 31.46 kg/m  Body mass index is 31.46 kg/m. Gen: Afebrile. No acute distress. Nontoxic in appearance, well developed, well nourished.  HENT: AT. [place].  MMM, no oral lesions.  Eyes:Pupils Equal Round Reactive to light, Extraocular movements intact,  Conjunctiva without redness, discharge or icterus. MSK: No  erythema, no soft tissue swelling, small round palpable nontender mass midline dorsal wrist.  Increased discomfort with flexion and extension of wrist.  Full range of motion.  Neurovascularly intact distally.  No exam data present No results found. No results found for this or any previous visit (from the past 24 hour(s)).  Assessment/Plan: Sheila Nunez is a 42 y.o. female present for OV for  Right wrist pain Mass of wrist, right -Patient was encouraged to try NSAIDs and capsaicin cream over area.  Obtain x-ray to rule out other potential causes.  If normal will refer to sports medicine.  Wear her splint during working hours. - DG Wrist Complete Right; Future -Follow-up 2 to 4 weeks.   Reviewed expectations re: course of current medical issues.  Discussed self-management of symptoms.  Outlined signs and symptoms  indicating need for more acute intervention.  Patient verbalized understanding and all questions were answered.  Patient received an After-Visit Summary.    No orders of the defined types were placed in this encounter.    Note is dictated utilizing voice recognition software. Although note has been proof read prior to signing, occasional typographical errors still can be missed. If any questions arise, please do not hesitate to call for verification.   electronically signed by:  Howard Pouch, DO  [place]

## 2018-04-29 ENCOUNTER — Ambulatory Visit
Admission: RE | Admit: 2018-04-29 | Discharge: 2018-04-29 | Disposition: A | Discharge status: Home / Self Care | Payer: BLUE CROSS/BLUE SHIELD | Source: Ambulatory Visit | Attending: Family Medicine | Admitting: Family Medicine

## 2018-04-29 ENCOUNTER — Encounter: Payer: Self-pay | Admitting: Family Medicine

## 2018-04-29 DIAGNOSIS — M25531 Pain in right wrist: Secondary | ICD-10-CM

## 2018-04-29 DIAGNOSIS — R2231 Localized swelling, mass and lump, right upper limb: Secondary | ICD-10-CM

## 2018-05-02 ENCOUNTER — Ambulatory Visit
Admission: RE | Admit: 2018-05-02 | Discharge: 2018-05-02 | Disposition: A | Discharge status: Home / Self Care | Payer: BLUE CROSS/BLUE SHIELD | Source: Ambulatory Visit | Attending: Family Medicine | Admitting: Family Medicine

## 2018-05-02 ENCOUNTER — Telehealth: Payer: Self-pay | Admitting: Family Medicine

## 2018-05-02 DIAGNOSIS — M25531 Pain in right wrist: Secondary | ICD-10-CM | POA: Diagnosis not present

## 2018-05-02 DIAGNOSIS — M7989 Other specified soft tissue disorders: Secondary | ICD-10-CM | POA: Diagnosis not present

## 2018-05-02 DIAGNOSIS — M67431 Ganglion, right wrist: Secondary | ICD-10-CM | POA: Insufficient documentation

## 2018-05-02 NOTE — Telephone Encounter (Signed)
Please inform patient the following information: Her wrist xray is without any fracture or bony abnormality.  - I have referred to [facility] and they should be calling her to schedule. Follow instructions provided during visit until that time.

## 2018-05-02 NOTE — Telephone Encounter (Signed)
Spoke with patient reviewed xray results ,instructions and information. Patient verbalized understanding.

## 2018-05-06 ENCOUNTER — Telehealth: Payer: Self-pay | Admitting: Sports Medicine

## 2018-05-06 ENCOUNTER — Encounter: Payer: Self-pay | Admitting: Physical Therapy

## 2018-05-06 ENCOUNTER — Encounter: Payer: Self-pay | Admitting: Sports Medicine

## 2018-05-06 ENCOUNTER — Ambulatory Visit: Payer: BLUE CROSS/BLUE SHIELD | Admitting: Sports Medicine

## 2018-05-06 VITALS — BP 110/86 | HR 74 | Ht 62.0 in | Wt 173.0 lb

## 2018-05-06 DIAGNOSIS — M67431 Ganglion, right wrist: Secondary | ICD-10-CM

## 2018-05-06 MED ORDER — DICLOFENAC SODIUM 2 % TD SOLN: 1.0000 "application " | Freq: Two times a day (BID) | TRANSDERMAL | 0 refills | Status: AC

## 2018-05-06 MED ORDER — DICLOFENAC SODIUM 2 % TD SOLN: 1.0000 "application " | Freq: Two times a day (BID) | TRANSDERMAL | 2 refills | Status: DC

## 2018-05-06 NOTE — Progress Notes (Signed)
Sheila Nunez. Sheila Nunez, Sheila Nunez  Sheila Nunez - 42 y.o. female MRN [id number]  Date of birth: Feb 11, 1976  Visit Date: 05/06/2018  PCP: Ma Hillock, DO   Referred by: Ma Hillock, DO  Scribe(s) for today's visit: Sheila Nunez, CMA  SUBJECTIVE:  Sheila Nunez is here for No chief complaint on file. Marland Kitchen  Referred by: Dr. Howard Pouch  Her R wrist pain symptoms INITIALLY: Began the end of May 2019 and she denies injury or trauma to the wrist.  Described as severe sharp pain (when flexed and bearing weight), nonradiating Worsened with wrist flexion.  Improved with rest.  Additional associated symptoms include: There is a mass present midline dorsal wrist. She doesn't have much pain with palpation.     At this time symptoms are worsening compared to onset.  She has been wearing wrist splint during working hours because she does a lot of typing at work. 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 She reports having a similar issue several years ago and she had it drained and she tolerated procedure well.   She had XR R wrist 05/02/2018.    REVIEW OF SYSTEMS: Denies night time disturbances. Denies fevers, chills, or night sweats. Denies unexplained weight loss. Denies personal history of cancer. Denies changes in bowel or bladder habits. Denies recent unreported falls. Denies new or worsening dyspnea or wheezing. Denies headaches or dizziness.  Denies numbness, tingling or weakness  In the extremities.  Denies dizziness or presyncopal episodes Denies lower extremity edema    HISTORY & PERTINENT PRIOR DATA:  Prior History reviewed and updated per electronic medical record.  Significant/pertinent history, findings, studies include:  reports that she has never smoked. She has never used smokeless  tobacco. Recent Labs    06/07/17 0844  HGBA1C 5.3   No specialty comments available. No problems updated.  OBJECTIVE:  VS:  HT:5' 2"  (157.5 cm)   WT:173 lb (78.5 kg)  BMI:31.63    BP:110/86  HR:74bpm  TEMP: ( )  RESP:97 %   PHYSICAL EXAM: Constitutional: WDWN, Non-toxic appearing. Psychiatric: Alert & appropriately interactive.  Not depressed or anxious appearing. Respiratory: No increased work of breathing.  Trachea Midline Eyes: Pupils are equal.  EOM intact without nystagmus.  No scleral icterus  Vascular Exam: warm to touch no edema  upper extremity neuro exam: unremarkable normal strength normal sensation  MSK Exam: Right wrist is overall well aligned.  She has a small ganglion that is worse with wrist flexion as well as extension.  This is mildly painful especially with wrist flexion.  Ulnar deviation, radial deviation and grip strength range of motion and strength are intact.  Radial pulses and ulnar pulses are normal.  Sensation is intact to light touch.   ASSESSMENT & PLAN:   1. Ganglion cyst of dorsum of right wrist     PLAN: See problem based charting  Follow-up: Return in about 1 month (around 06/03/2018).      Please see additional documentation for Objective, Assessment and Plan sections. Pertinent additional documentation may be included in corresponding procedure notes, imaging studies, problem based documentation and patient instructions. Please see these sections of the encounter for additional information regarding this visit.  CMA/ATC served as Education administrator during this visit. History, Physical, and Plan performed by medical provider. Documentation  and orders reviewed and attested to.      [name], [place] [facility] Physician

## 2018-05-06 NOTE — Patient Instructions (Signed)
[facility] instructions for Duexis, Pennsaid and Vimovo:  Your prescription will be filled through a mail order pharmacy.  It is typically [facility] but may vary depending on where you live.  You will receive a phone call from them which will typically come from a [phone] phone number.  You must speak directly to them to have this medication filled.  When the pharmacy calls, they will need your mailing address (for overnight shipment of the medication) andy they will need payment information if you have a copay (typically no more than $10). If you have not heard from them 2-3 days after your appointment with Dr. Paulla Fore, contact us at the office [phone]) or through [place] so we can reach back out to the pharmacy.

## 2018-05-06 NOTE — Telephone Encounter (Signed)
See note.   Copied from [place] [id number] [id number]. Topic: Inquiry >> May 06, 2018 10:01 AM Nils Flack wrote: Reason for CRM: pt needs to have note for work. Please post to mychart.  Cb is [phone]

## 2018-05-06 NOTE — Assessment & Plan Note (Signed)
Symptoms are consistent with a small ganglion cyst from the synovium of the proximal carpal row.  We will plan to start with topical Pennsaid twice daily x2 weeks and cool water soaking.  If any lack of improvement can consider injection at that time but she would like to hold off on this today.

## 2018-06-03 ENCOUNTER — Encounter: Payer: Self-pay | Admitting: Family Medicine

## 2018-06-10 ENCOUNTER — Encounter: Payer: Self-pay | Admitting: Family Medicine

## 2018-06-10 ENCOUNTER — Ambulatory Visit (INDEPENDENT_AMBULATORY_CARE_PROVIDER_SITE_OTHER): Payer: BLUE CROSS/BLUE SHIELD | Admitting: Family Medicine

## 2018-06-10 VITALS — BP 112/79 | HR 55 | Temp 98.3°F | Resp 20 | Ht 62.0 in | Wt 173.0 lb

## 2018-06-10 DIAGNOSIS — Z0000 Encounter for general adult medical examination without abnormal findings: Secondary | ICD-10-CM | POA: Diagnosis not present

## 2018-06-10 DIAGNOSIS — Z130 Encounter for screening for diseases of the blood and blood-forming organs and certain disorders involving the immune mechanism: Secondary | ICD-10-CM

## 2018-06-10 DIAGNOSIS — Z131 Encounter for screening for diabetes mellitus: Secondary | ICD-10-CM | POA: Diagnosis not present

## 2018-06-10 DIAGNOSIS — E669 Obesity, unspecified: Secondary | ICD-10-CM

## 2018-06-10 DIAGNOSIS — E559 Vitamin D deficiency, unspecified: Secondary | ICD-10-CM | POA: Diagnosis not present

## 2018-06-10 LAB — CBC WITH DIFFERENTIAL/PLATELET
BASOS ABS: 0.1 10*3/uL (ref 0.0–0.1)
BASOS PCT: 1.2 % (ref 0.0–3.0)
EOS PCT: 1 % (ref 0.0–5.0)
Eosinophils Absolute: 0.1 10*3/uL (ref 0.0–0.7)
HCT: 41.6 % (ref 36.0–46.0)
Hemoglobin: 14.1 g/dL (ref 12.0–15.0)
LYMPHS ABS: 1.8 10*3/uL (ref 0.7–4.0)
LYMPHS PCT: 34.6 % (ref 12.0–46.0)
MCHC: 33.9 g/dL (ref 30.0–36.0)
MCV: 88.2 fl (ref 78.0–100.0)
Monocytes Absolute: 0.3 10*3/uL (ref 0.1–1.0)
Monocytes Relative: 6.1 % (ref 3.0–12.0)
NEUTROS ABS: 3 10*3/uL (ref 1.4–7.7)
NEUTROS PCT: 57.1 % (ref 43.0–77.0)
Platelets: 168 10*3/uL (ref 150.0–400.0)
RBC: 4.71 Mil/uL (ref 3.87–5.11)
RDW: 13.1 % (ref 11.5–15.5)
WBC: 5.3 10*3/uL (ref 4.0–10.5)

## 2018-06-10 LAB — COMPREHENSIVE METABOLIC PANEL
ALT: 8 U/L (ref 0–35)
AST: 8 U/L (ref 0–37)
Albumin: 4.1 g/dL (ref 3.5–5.2)
Alkaline Phosphatase: 68 U/L (ref 39–117)
BUN: 6 mg/dL (ref 6–23)
CHLORIDE: 104 meq/L (ref 96–112)
CO2: 29 mEq/L (ref 19–32)
Calcium: 9 mg/dL (ref 8.4–10.5)
Creatinine, Ser: 0.76 mg/dL (ref 0.40–1.20)
GFR: 107.54 mL/min (ref >60.00)
Glucose, Bld: 90 mg/dL (ref 70–99)
POTASSIUM: 4.4 meq/L (ref 3.5–5.1)
Sodium: 138 mEq/L (ref 135–145)
Total Bilirubin: 0.7 mg/dL (ref 0.2–1.2)
Total Protein: 7.2 g/dL (ref 6.0–8.3)

## 2018-06-10 LAB — LIPID PANEL
CHOLESTEROL: 169 mg/dL (ref 0–200)
HDL: 54.6 mg/dL (ref >39.00)
LDL CALC: 105 mg/dL — AB (ref 0–99)
NonHDL: 114.67
TRIGLYCERIDES: 47 mg/dL (ref 0.0–149.0)
Total CHOL/HDL Ratio: 3
VLDL: 9.4 mg/dL (ref 0.0–40.0)

## 2018-06-10 LAB — HEMOGLOBIN A1C: HEMOGLOBIN A1C: 5.4 % (ref 4.6–6.5)

## 2018-06-10 LAB — VITAMIN D 25 HYDROXY (VIT D DEFICIENCY, FRACTURES): VITD: 12.97 ng/mL — ABNORMAL LOW (ref 30.00–100.00)

## 2018-06-10 NOTE — Progress Notes (Signed)
Patient ID: Sheila Nunez, female  DOB: [birth year], 42 y.o.   MRN: [id number] Patient Care Team    Relationship Specialty Notifications Start End  Ma Hillock, DO PCP - General Family Medicine  07/19/15   Tiajuana Amass, MD Referring Physician Allergy and Immunology  05/25/16     Chief Complaint  Patient presents with  . Annual Exam    Subjective:  Sheila Nunez is a 42 y.o.  Female  present for CPE. All past medical history, surgical history, allergies, family history, immunizations, medications and social history were updated in the electronic medical record today. All recent labs, ED visits and hospitalizations within the last year were reviewed.  Well women: Menstrual cycles approximately every 20-30 days, her menses still are not heavy, last about 5 days days or less.  She denies vaginal discharge or irritiation. No concerns or complaints today gynecologically. She does not routinely  performs SBE exams. Patient's last menstrual period was Patient's last menstrual period was 05/20/2018.   Health maintenance: updated 06/10/18 Colonoscopy: No Fhx screen at 50 Mammogram: No fhx. Completed 09/12/2017. [facility]ads 1. Breast center.  Cervical cancer screening: 08/09/2016. last pap completed here, results: normal/neg co test. 3-5 year rpt. Immunizations: tdap 06/2014, Influenza --> allergic Infectious disease screening: HIV completed 2014 DEXA: N/A Assistive device: None Oxygen VEL:FYBO Patient has a Dental home. Hospitalizations/ED visits: None   Depression screen [facility] 2/9 06/10/2018 04/29/2018 06/07/2017 05/25/2016  Decreased Interest 0 0 0 0  Down, Depressed, Hopeless 0 0 0 0  PHQ - 2 Score 0 0 0 0   No flowsheet data found.   Current Exercise Habits: Structured exercise class, Time (Minutes): 60, Frequency (Times/Week): 3, Weekly Exercise (Minutes/Week): 180, Intensity: Moderate Exercise limited by: None identified   Immunization History  Administered Date(s) Administered    . Tdap 07/14/2014     Past Medical History:  Diagnosis Date  . Allergy    Dr. Allyson Sabal  . Depression   . Eczema    Dr. Allyson Sabal   Allergies  Allergen Reactions  . Zithromax [Azithromycin] Hives   Past Surgical History:  Procedure Laterality Date  . CYST EXCISION     eyelids, Dr. Gershon Crane   Family History  Problem Relation Age of Onset  . GER disease Brother   . Cancer Neg Hx    Social History   Socioeconomic History  . Marital status: Single    Spouse name: Not on file  . Number of children: Not on file  . Years of education: Not on file  . Highest education level: Not on file  Occupational History  . Not on file  Social Needs  . Financial resource strain: Not on file  . Food insecurity:    Worry: Not on file    Inability: Not on file  . Transportation needs:    Medical: Not on file    Non-medical: Not on file  Tobacco Use  . Smoking status: Never Smoker  . Smokeless tobacco: Never Used  Substance and Sexual Activity  . Alcohol use: Yes    Alcohol/week: 1.2 oz    Types: 2 Glasses of wine per week  . Drug use: No  . Sexual activity: Yes    Partners: Male  Lifestyle  . Physical activity:    Days per week: Not on file    Minutes per session: Not on file  . Stress: Not on file  Relationships  . Social connections:    Talks on phone: Not on  file    Gets together: Not on file    Attends religious service: Not on file    Active member of club or organization: Not on file    Attends meetings of clubs or organizations: Not on file    Relationship status: Not on file  . Intimate partner violence:    Fear of current or ex partner: Not on file    Emotionally abused: Not on file    Physically abused: Not on file    Forced sexual activity: Not on file  Other Topics Concern  . Not on file  Social History Narrative   Single. 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   Never smoker,  occ etoh, no drugs.       Allergies as of 06/10/2018      Reactions   Zithromax [azithromycin] Hives      Medication List        Accurate as of 06/10/18  8:37 AM. Always use your most recent med list.          Diclofenac Sodium 2 % Soln Commonly known as:  PENNSAID Place 1 application onto the skin 2 (two) times daily.   fluticasone 50 MCG/ACT nasal spray Commonly known as:  FLONASE Place 2 sprays into both nostrils daily.   levocetirizine 5 MG tablet Commonly known as:  XYZAL Take 1 tablet (5 mg total) by mouth every evening.       All past medical history, surgical history, allergies, family history, immunizations andmedications were updated in the EMR today and reviewed under the history and medication portions of their EMR.     No results found for this or any previous visit (from the past 2160 hour(s)).  Dg Wrist Complete Right  Result Date: 05/02/2018 CLINICAL DATA:  Posterior soft tissue swelling.  Pain. EXAM: RIGHT WRIST - COMPLETE 3+ VIEW COMPARISON:  None. FINDINGS: There is no evidence of fracture or dislocation. There is no evidence of arthropathy or other focal bone abnormality. Soft tissues are unremarkable. IMPRESSION: Negative. Electronically Signed   By: Rolm Baptise M.D.   On: 05/02/2018 10:56     ROS: 14 pt review of systems performed and negative (unless mentioned in an HPI)  Objective: BP 112/79 (BP Location: Right Arm, Patient Position: Sitting, Cuff Size: Normal)   Pulse (!) 55   Temp 98.3 F (36.8 C)   Resp 20   Ht 5' 2"  (1.575 m)   Wt 173 lb (78.5 kg)   LMP 05/20/2018   SpO2 98%   BMI 31.64 kg/m  Gen: Afebrile. No acute distress. Nontoxic in appearance, well-developed, well-nourished,  Pleasant obese female.  HENT: AT. [place]. Bilateral TM visualized and normal in appearance, normal external auditory canal. MMM, no oral lesions, adequate dentition. Bilateral nares within normal limits. Throat without erythema, ulcerations or exudates. no  Cough on exam, no hoarseness on exam. Eyes:Pupils Equal Round Reactive to light, Extraocular movements intact,  Conjunctiva without redness, discharge or icterus. Neck/lymp/endocrine: Supple,no lymphadenopathy, no thyromegaly CV: RRR no murmur, no edema, +2/4 P posterior tibialis pulses. no carotid bruits. No JVD. Chest: CTAB, no wheeze, rhonchi or crackles. normal Respiratory effort. good Air movement. Abd: Soft. obese. NTND. BS present. no Masses palpated. No hepatosplenomegaly. No rebound tenderness or guarding. Skin: no rashes, purpura or petechiae. Warm and well-perfused. Skin intact. Neuro/Msk:  Normal gait. PERLA. EOMi. Alert. Oriented x3.  Cranial nerves II through  XII intact. Muscle strength 5/5 upper/lower extremity. DTRs equal bilaterally. Psych: Normal affect, dress and demeanor. Normal speech. Normal thought content and judgment.  No exam data present  Assessment/plan: Sheila Nunez is a 42 y.o. female present for CPE. Screening for diabetes mellitus - HgB A1c Vitamin D deficiency - Vitamin D (25 hydroxy)--> no taking vit d regularly  Screening for iron deficiency anemia - decreased all meat products.  - CBC w/Diff Obesity (BMI 30-39.9) - Lipid panel Encounter for preventive health examination - Comp Met (CMET) Patient was encouraged to exercise greater than 150 minutes a week. Patient was encouraged to choose a diet filled with fresh fruits and vegetables, and lean meats. AVS provided to patient today for education/recommendation on gender specific health and safety maintenance. Colonoscopy: No Fhx screen at 50 Mammogram: No fhx. UTD-Completed 09/12/2017. BiRads 1. Breast center.  Cervical cancer screening: 08/09/2016. last pap completed here, results: normal/neg co test. 3-5 year rpt. Immunizations: tdap 06/2014, Influenza --> allergic Infectious disease screening: HIV completed 2014 DEXA: N/A  Return in about 1 year (around 06/11/2019) for CPE.  Electronically signed  by: Howard Pouch, DO [place]

## 2018-06-10 NOTE — Patient Instructions (Signed)

## 2018-06-11 ENCOUNTER — Telehealth: Payer: Self-pay | Admitting: Family Medicine

## 2018-06-11 DIAGNOSIS — E559 Vitamin D deficiency, unspecified: Secondary | ICD-10-CM

## 2018-06-11 MED ORDER — VITAMIN D (ERGOCALCIFEROL) 1.25 MG (50000 UNIT) PO CAPS
50000.0000 [IU] | ORAL_CAPSULE | ORAL | 0 refills | Status: DC
Start: 2018-06-11 — End: 2018-09-03

## 2018-06-11 NOTE — Telephone Encounter (Signed)
Spoke with patient reviewed lab results and instructions. Patient verbalized understanding.patient states she will have to check her schedule and call back to set up lab appt and 2 days later provider appt.

## 2018-06-11 NOTE — Telephone Encounter (Signed)
Please inform patient the following information: Her liver, kidney fx is normal, cholesterol looks great- similar to last year, blood counts are normal, diabetes screen is normal.  Her vitamin D is very low again 16 last year to --> 12.    - Start prescribed weekly med and OTC 2000u daily with food that should be continued even after prescribed med is complete.  Follow up in 12 weeks for follow up with provider- I did put the lab order in she can have it completed 2 days prior- only if lab appt and provider appt scheduled- (no labs w/out a provider appt to follow).   Results for orders placed or performed in visit on 06/10/18 (from the past 72 hour(s))  CBC w/Diff     Status: None   Collection Time: 06/10/18  8:44 AM  Result Value Ref Range   WBC 5.3 4.0 - 10.5 K/uL   RBC 4.71 3.87 - 5.11 Mil/uL   Hemoglobin 14.1 12.0 - 15.0 g/dL   HCT 41.6 36.0 - 46.0 %   MCV 88.2 78.0 - 100.0 fl   MCHC 33.9 30.0 - 36.0 g/dL   RDW 13.1 11.5 - 15.5 %   Platelets 168.0 150.0 - 400.0 K/uL   Neutrophils Relative % 57.1 43.0 - 77.0 %   Lymphocytes Relative 34.6 12.0 - 46.0 %   Monocytes Relative 6.1 3.0 - 12.0 %   Eosinophils Relative 1.0 0.0 - 5.0 %   Basophils Relative 1.2 0.0 - 3.0 %   Neutro Abs 3.0 1.4 - 7.7 K/uL   Lymphs Abs 1.8 0.7 - 4.0 K/uL   Monocytes Absolute 0.3 0.1 - 1.0 K/uL   Eosinophils Absolute 0.1 0.0 - 0.7 K/uL   Basophils Absolute 0.1 0.0 - 0.1 K/uL  Comp Met (CMET)     Status: None   Collection Time: 06/10/18  8:44 AM  Result Value Ref Range   Sodium 138 135 - 145 mEq/L   Potassium 4.4 3.5 - 5.1 mEq/L   Chloride 104 96 - 112 mEq/L   CO2 29 19 - 32 mEq/L   Glucose, Bld 90 70 - 99 mg/dL   BUN 6 6 - 23 mg/dL   Creatinine, Ser 0.76 0.40 - 1.20 mg/dL   Total Bilirubin 0.7 0.2 - 1.2 mg/dL   Alkaline Phosphatase 68 39 - 117 U/L   AST 8 0 - 37 U/L   ALT 8 0 - 35 U/L   Total Protein 7.2 6.0 - 8.3 g/dL   Albumin 4.1 3.5 - 5.2 g/dL   Calcium 9.0 8.4 - 10.5 mg/dL   GFR 107.54 >60.00  mL/min  Lipid panel     Status: Abnormal   Collection Time: 06/10/18  8:44 AM  Result Value Ref Range   Cholesterol 169 0 - 200 mg/dL    Comment: ATP III Classification       Desirable:  < 200 mg/dL               Borderline High:  200 - 239 mg/dL          High:  > = 240 mg/dL   Triglycerides 47.0 0.0 - 149.0 mg/dL    Comment: Normal:  <150 mg/dLBorderline High:  150 - 199 mg/dL   HDL 54.60 >39.00 mg/dL   VLDL 9.4 0.0 - 40.0 mg/dL   LDL Cholesterol 105 (H) 0 - 99 mg/dL   Total CHOL/HDL Ratio 3     Comment:  Men          Women1/2 Average Risk     3.4          3.3Average Risk          5.0          4.42X Average Risk          9.6          7.13X Average Risk          15.0          11.0                       NonHDL 114.67     Comment: NOTE:  Non-HDL goal should be 30 mg/dL higher than patient's LDL goal (i.e. LDL goal of < 70 mg/dL, would have non-HDL goal of < 100 mg/dL)  HgB A1c     Status: None   Collection Time: 06/10/18  8:44 AM  Result Value Ref Range   Hgb A1c MFr Bld 5.4 4.6 - 6.5 %    Comment: Glycemic Control Guidelines for People with Diabetes:Non Diabetic:  <6%Goal of Therapy: <7%Additional Action Suggested:  >8%   Vitamin D (25 hydroxy)     Status: Abnormal   Collection Time: 06/10/18  8:44 AM  Result Value Ref Range   VITD 12.97 (L) 30.00 - 100.00 ng/mL

## 2018-06-17 ENCOUNTER — Ambulatory Visit: Payer: BLUE CROSS/BLUE SHIELD | Admitting: Sports Medicine

## 2018-06-17 ENCOUNTER — Encounter: Payer: Self-pay | Admitting: Sports Medicine

## 2018-06-17 ENCOUNTER — Ambulatory Visit: Payer: Self-pay

## 2018-06-17 VITALS — BP 120/80 | HR 63 | Ht 62.0 in | Wt 173.8 lb

## 2018-06-17 DIAGNOSIS — M67431 Ganglion, right wrist: Secondary | ICD-10-CM

## 2018-06-17 NOTE — Patient Instructions (Signed)
I recommend you obtained a compression sleeve to help with your joint problems. There are many options on the market however I recommend obtaining a wrist Body Helix compression sleeve.  You can find information (including how to appropriate measure yourself for sizing) can be found at www.Body http://www.lambert.com/.  Many of these products are health savings account (HSA) eligible.   You can use the compression sleeve at any time throughout the day but is most important to use while being active as well as for 2 hours post-activity.   It is appropriate to ice following activity with the compression sleeve in place.

## 2018-06-17 NOTE — Progress Notes (Signed)
Sheila Nunez. Sheila Nunez, [place] at [place]  Sheila Nunez - 42 y.o. female MRN [id number]  Date of birth: 05-Jun-1976  Visit Date: 06/17/2018  PCP: Ma Hillock, DO   Referred by: Ma Hillock, DO  Scribe(s) for today's visit: Josepha Pigg, CMA  SUBJECTIVE:  Sheila Nunez "Sheila Nunez" is here for Follow-up (R wrist)   05/06/2018: Her R wrist pain symptoms INITIALLY: Began the end of May 2019 and she denies injury or trauma to the wrist.  Described as severe sharp pain (when flexed and bearing weight), nonradiating Worsened with wrist flexion.  Improved with rest.  Additional associated symptoms include: There is a mass present midline dorsal wrist. She doesn't have much pain with palpation.    At this time symptoms are worsening compared to onset.  She has been wearing wrist splint during working hours because she does a lot of typing at work. 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 She reports having a similar issue several years ago and she had it drained and she tolerated procedure well.  She had XR R wrist 05/02/2018.   06/17/2018: Compared to the last office visit, her previously described symptoms are imporiving. 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 She reports that within a couple of days the cyst went away. Last week she noticed that the cyst has returned but she is not having pain now. She also c/o decreased grip strength but she hasn't actually dropped anything. She notes that she did tear a ligament in her thumb a few years ago so she isn't sure if the decreased grip strength is related to her thumb or to the cyst.  Current symptoms are mild & are nonradiating She has been using Pennsaid. She has not been  wearing brace/splint.    REVIEW OF SYSTEMS: Denies night time disturbances. Denies fevers, chills, or night sweats. Denies unexplained weight loss. Denies personal history of cancer. Denies changes in bowel or bladder habits. Reports recent unreported falls. Denies new or worsening dyspnea or wheezing. Denies headaches or dizziness.  Reports numbness, tingling or weakness in R upper extremities.  Denies dizziness or presyncopal episodes Denies lower extremity edema    HISTORY & PERTINENT PRIOR DATA:  Prior History reviewed and updated per electronic medical record.  Significant/pertinent history, findings, studies include:  reports that she has never smoked. She has never used smokeless tobacco. Recent Labs    06/10/18 0844  HGBA1C 5.4   No specialty comments available. No problems updated.  OBJECTIVE:  VS:  HT:5' 2"  (157.5 cm)   WT:173 lb 12.8 oz (78.8 kg)  BMI:31.78    BP:120/80  HR:63bpm  TEMP: ( )  RESP:98 %   PHYSICAL EXAM: Constitutional: WDWN, Non-toxic appearing. Psychiatric: Alert & appropriately interactive.  Not depressed or anxious appearing. Respiratory: No increased work of breathing.  Trachea Midline Eyes: Pupils are equal.  EOM intact without nystagmus.  No scleral icterus  Vascular Exam: warm to touch no edema  upper extremity neuro exam: unremarkable  MSK Exam: Right wrist is overall well aligned she is a very small ganglion cyst across the dorsum of the wrist that is nonpainful.  She has good flexion and extension.  Normal ulnar deviation and radial deviation.  Grip strength intact.  Negative Finkelstein.   ASSESSMENT & PLAN:   1. Ganglion cyst of dorsum of right wrist     PLAN: Right wrist overall good improvement.  She is not having any significant worsening of her symptoms at this time but there is some prominent in this region.  She will benefit from compression and Body Helix Compression Sleeve full wrist phlebolith provided today.   We will plan to have her continue with topical diclofenac and icing and avoidance of exacerbating activities.  Can consider repeat injections if needed.  Follow-up: Return in about 8 weeks (around 08/12/2018).      Please see additional documentation for Objective, Assessment and Plan sections. Pertinent additional documentation may be included in corresponding procedure notes, imaging studies, problem based documentation and patient instructions. Please see these sections of the encounter for additional information regarding this visit.  CMA/ATC served as Education administrator during this visit. History, Physical, and Plan performed by medical provider. Documentation and orders reviewed and attested to.      [name], [facility] Physician

## 2018-06-20 ENCOUNTER — Encounter: Payer: Self-pay | Admitting: Sports Medicine

## 2018-06-24 ENCOUNTER — Encounter: Payer: Self-pay | Admitting: Sports Medicine

## 2018-06-24 NOTE — Procedures (Signed)
LIMITED MSK ULTRASOUND OF Right wrist Images were obtained and interpreted by myself, Teresa Coombs, DO  Images have been saved and stored to PACS system. Images obtained on: GE S7 Ultrasound machine  FINDINGS:   Dorsal ganglion is present but much smaller and tracking across the the deep and superficial tissues.  Minimal increased neovascularity.  IMPRESSION:  1. Resolving ganglion cyst

## 2018-08-12 ENCOUNTER — Ambulatory Visit: Payer: BLUE CROSS/BLUE SHIELD | Admitting: Sports Medicine

## 2018-08-12 ENCOUNTER — Encounter: Payer: Self-pay | Admitting: Sports Medicine

## 2018-08-12 VITALS — BP 104/72 | HR 71 | Ht 62.0 in | Wt 176.2 lb

## 2018-08-12 DIAGNOSIS — M67431 Ganglion, right wrist: Secondary | ICD-10-CM | POA: Diagnosis not present

## 2018-08-12 NOTE — Progress Notes (Signed)
Sheila Nunez. Kenly Henckel, [place] at [place]  Sheila Nunez - 42 y.o. female MRN [id number]  Date of birth: 02-09-1976  Visit Date: 08/12/2018  PCP: Ma Hillock, DO   Referred by: Ma Hillock, DO  Scribe(s) for today's visit: Wendy Poet, LAT, ATC  SUBJECTIVE:  Sheila Nunez "Sheila Nunez" is here for Follow-up (R wrist cyst )   05/06/2018: Her R wrist pain symptoms INITIALLY: Began the end of May 2019 and she denies injury or trauma to the wrist.  Described as severe sharp pain (when flexed and bearing weight), nonradiating Worsened with wrist flexion.  Improved with rest.  Additional associated symptoms include: There is a mass present midline dorsal wrist. She doesn't have much pain with palpation.    At this time symptoms are worsening compared to onset.  She has been wearing wrist splint during working hours because she does a lot of typing at work. 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 She reports having a similar issue several years ago and she had it drained and she tolerated procedure well.  She had XR R wrist 05/02/2018.   06/17/2018: Compared to the last office visit, her previously described symptoms are imporiving. 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 She reports that within a couple of days the cyst went away. Last week she noticed that the cyst has returned but she is not having pain now. She also c/o decreased grip strength but she hasn't actually dropped anything. She notes that she did tear a ligament in her thumb a few years ago so she isn't sure if the decreased grip strength is related to her thumb or to the cyst.  Current symptoms are mild & are nonradiating She has been using Pennsaid. She has not  been wearing brace/splint.   08/12/2018: Compared to the last office visit on 06/17/18, her previously described R wrist pain symptoms are improving w/ the cyst being smaller in side.  She says that the cyst seems to move.  She notes that being in a high plank or push-up position is the most irritating position. Current symptoms are ranging from mild to moderate depending on activity & are nonradiating She has been using Pennsaid. She has been wearing her Body Helix wrist brace.  REVIEW OF SYSTEMS: Denies night time disturbances. Denies fevers, chills, or night sweats. Denies unexplained weight loss. Denies personal history of cancer. Denies changes in bowel or bladder habits. Denies recent unreported falls. Denies new or worsening dyspnea or wheezing. Denies headaches or dizziness.  Reports numbness, tingling or weakness in R upper extremities.  Denies dizziness or presyncopal episodes Denies lower extremity edema    HISTORY:  Prior history reviewed and updated per electronic medical record.  Social History   Occupational History  . Not on file  Tobacco Use  . Smoking status: Never Smoker  . Smokeless tobacco: Never Used  Substance and Sexual Activity  . Alcohol use: Yes    Alcohol/week: 2.0 standard drinks    Types: 2 Glasses of wine per week  . Drug use: No  . Sexual activity: Yes    Partners: Male   Social History   Social History Narrative  Single. 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   Never smoker, occ etoh, no drugs.         DATA OBTAINED & REVIEWED:   Recent Labs    06/10/18 0844  HGBA1C 5.4   . 05/02/2018: Complete x-ray right wrist: Normal-appearing.  No evidence of scapholunate widening. . 06/17/2018: Ultrasound: Resolving ganglion/synovial cyst of the wrist joint. .   OBJECTIVE:  VS:  HT:5' 2"  (157.5 cm)   WT:176 lb 3.2 oz (79.9 kg)  BMI:32.22    BP:104/72   HR:71bpm  TEMP: ( )  RESP:96 %   PHYSICAL EXAM: CONSTITUTIONAL: Well-developed, Well-nourished and In no acute distress PSYCHIATRIC: Alert & appropriately interactive. and Not depressed or anxious appearing. RESPIRATORY: No increased work of breathing and Trachea Midline EYES: Pupils are equal., EOM intact without nystagmus. and No scleral icterus.  VASCULAR EXAM: Warm and well perfused NEURO: unremarkable  MSK Exam: Right wrist  Well aligned, no significant deformity. No overlying skin changes. No focal bony tenderness   RANGE OF MOTION & STRENGTH  Normal, non-painful Grip strength, ulnar deviation, radial deviation.   SPECIALITY TESTING:  Negative shuck test although there is a small amount of crepitation with this.  There is good stability.  Pain is only present with terminal wrist extension but this is mild.  Negative Tinel's and negative Finkelstein.     ASSESSMENT   1. Ganglion cyst of dorsum of right wrist     PLAN:  Pertinent additional documentation may be included in corresponding procedure notes, imaging studies, problem based documentation and patient instructions.  Procedures:  . None  Medications:  No orders of the defined types were placed in this encounter.  Discussion/Instructions: No problem-specific Assessment & Plan notes found for this encounter.  . Overall her symptoms are mild and she would like to continue with just conservative management including topical Pennsaid, body Helix compression sleeve and ice.  . Discussed red flag symptoms that warrant earlier emergent evaluation and patient voices understanding. . Activity modifications and the importance of avoiding exacerbating activities (limiting pain to no more than a 4 / 10 during or following activity) recommended and discussed.  Follow-up:  . Return if symptoms worsen or fail to improve.   . If any lack of improvement consider: Injection  .      CMA/ATC served as Education administrator during this  visit. History, Physical, and Plan performed by medical provider. Documentation and orders reviewed and attested to.      [name], [facility] Physician

## 2018-08-20 ENCOUNTER — Other Ambulatory Visit: Payer: Self-pay | Admitting: Family Medicine

## 2018-08-20 DIAGNOSIS — Z1231 Encounter for screening mammogram for malignant neoplasm of breast: Secondary | ICD-10-CM

## 2018-08-30 ENCOUNTER — Other Ambulatory Visit (INDEPENDENT_AMBULATORY_CARE_PROVIDER_SITE_OTHER): Payer: BLUE CROSS/BLUE SHIELD

## 2018-08-30 DIAGNOSIS — E559 Vitamin D deficiency, unspecified: Secondary | ICD-10-CM

## 2018-08-30 LAB — VITAMIN D 25 HYDROXY (VIT D DEFICIENCY, FRACTURES): VITD: 51.24 ng/mL (ref 30.00–100.00)

## 2018-09-03 ENCOUNTER — Encounter: Payer: Self-pay | Admitting: Family Medicine

## 2018-09-03 ENCOUNTER — Ambulatory Visit: Payer: BLUE CROSS/BLUE SHIELD | Admitting: Family Medicine

## 2018-09-03 VITALS — BP 116/80 | HR 56 | Temp 98.7°F | Resp 20 | Ht 62.0 in | Wt 177.2 lb

## 2018-09-03 DIAGNOSIS — E559 Vitamin D deficiency, unspecified: Secondary | ICD-10-CM | POA: Diagnosis not present

## 2018-09-03 NOTE — Patient Instructions (Addendum)
Vit D levels look great now. Make sure to maintain level with daily vit d 2000 u supplement with meals for bone health.    Please help [place] help you:  We are honored you have chosen [facility] for your Primary Care home. Below you will find basic instructions that you may need to access in the future. Please help [place] help you by reading the instructions, which cover many of the frequent questions we experience.   Prescription refills and request:  -In order to allow more efficient response time, please call your pharmacy for all refills. They will forward the request electronically to [place]. This allows for the quickest possible response. Request left on a nurse line can take longer to refill, since these are checked as time allows between office patients and other phone calls.  - refill request can take up to 3-5 working days to complete.  - If request is sent electronically and request is appropiate, it is usually completed in 1-2 business days.  - all patients will need to be seen routinely for all chronic medical conditions requiring prescription medications (see follow-up below). If you are overdue for follow up on your condition, you will be asked to make an appointment and we will call in enough medication to cover you until your appointment (up to 30 days).  - all controlled substances will require a face to face visit to request/refill.  - if you desire your prescriptions to go through a new pharmacy, and have an active script at original pharmacy, you will need to call your pharmacy and have scripts transferred to new pharmacy. This is completed between the pharmacy locations and not by your provider.    Results: If any images or labs were ordered, it can take up to 1 week to get results depending on the test ordered and the lab/facility running and resulting the test. - Normal or stable results, which do not need further discussion, may be released to your mychart immediately with attached  note to you. A call may not be generated for normal results. Please make certain to sign up for mychart. If you have questions on how to activate your mychart you can call the front office.  - If your results need further discussion, our office will attempt to contact you via phone, and if unable to reach you after 2 attempts, we will release your abnormal result to your mychart with instructions.  - All results will be automatically released in mychart after 1 week.  - Your provider will provide you with explanation and instruction on all relevant material in your results. Please keep in mind, results and labs may appear confusing or abnormal to the untrained eye, but it does not mean they are actually abnormal for you personally. If you have any questions about your results that are not covered, or you desire more detailed explanation than what was provided, you should make an appointment with your provider to do so.   Our office handles many outgoing and incoming calls daily. If we have not contacted you within 1 week about your results, please check your mychart to see if there is a message first and if not, then contact our office.  In helping with this matter, you help decrease call volume, and therefore allow [place] to be able to respond to patients needs more efficiently.   Acute office visits (sick visit):  An acute visit is intended for a new problem and are scheduled in shorter time  slots to allow schedule openings for patients with new problems. This is the appropriate visit to discuss a new problem. Problems will not be addressed by phone call or Echart message. Appointment is needed if requesting treatment. In order to provide you with excellent quality medical care with proper time for you to explain your problem, have an exam and receive treatment with instructions, these appointments should be limited to one new problem per visit. If you experience a new problem, in which you desire to be  addressed, please make an acute office visit, we save openings on the schedule to accommodate you. Please do not save your new problem for any other type of visit, let us take care of it properly and quickly for you.   Follow up visits:  Depending on your condition(s) your provider will need to see you routinely in order to provide you with quality care and prescribe medication(s). Most chronic conditions (Example: hypertension, Diabetes, depression/anxiety... etc), require visits a couple times a year. Your provider will instruct you on proper follow up for your personal medical conditions and history. Please make certain to make follow up appointments for your condition as instructed. Failing to do so could result in lapse in your medication treatment/refills. If you request a refill, and are overdue to be seen on a condition, we will always provide you with a 30 day script (once) to allow you time to schedule.    Medicare wellness (well visit): - we have a wonderful Nurse Maudie Mercury), that will meet with you and provide you will yearly medicare wellness visits. These visits should occur yearly (can not be scheduled less than 1 calendar year apart) and cover preventive health, immunizations, advance directives and screenings you are entitled to yearly through your medicare benefits. Do not miss out on your entitled benefits, this is when medicare will pay for these benefits to be ordered for you.  These are strongly encouraged by your provider and is the appropriate type of visit to make certain you are up to date with all preventive health benefits. If you have not had your medicare wellness exam in the last 12 months, please make certain to schedule one by calling the office and schedule your medicare wellness with Maudie Mercury as soon as possible.   Yearly physical (well visit):  - Adults are recommended to be seen yearly for physicals. Check with your insurance and date of your last physical, most insurances  require one calendar year between physicals. Physicals include all preventive health topics, screenings, medical exam and labs that are appropriate for gender/age and history. You may have fasting labs needed at this visit. This is a well visit (not a sick visit), new problems should not be covered during this visit (see acute visit).  - Pediatric patients are seen more frequently when they are younger. Your provider will advise you on well child visit timing that is appropriate for your their age. - This is not a medicare wellness visit. Medicare wellness exams do not have an exam portion to the visit. Some medicare companies allow for a physical, some do not allow a yearly physical. If your medicare allows a yearly physical you can schedule the medicare wellness with our nurse Maudie Mercury and have your physical with your provider after, on the same day. Please check with insurance for your full benefits.   Late Policy/No Shows:  - all new patients should arrive 15-30 minutes earlier than appointment to allow [place] time  to  obtain all personal  demographics,  insurance information and for you to complete office paperwork. - All established patients should arrive 10-15 minutes earlier than appointment time to update all information and be checked in .  - In our best efforts to run on time, if you are late for your appointment you will be asked to either reschedule or if able, we will work you back into the schedule. There will be a wait time to work you back in the schedule,  depending on availability.  - If you are unable to make it to your appointment as scheduled, please call 24 hours ahead of time to allow [place] to fill the time slot with someone else who needs to be seen. If you do not cancel your appointment ahead of time, you may be charged a no show fee.

## 2018-09-03 NOTE — Progress Notes (Signed)
Sheila Nunez , May 04, 1976, 42 y.o., female MRN: [id number] Patient Care Team    Relationship Specialty Notifications Start End  Ma Hillock, DO PCP - General Family Medicine  07/19/15   Tiajuana Amass, MD Referring Physician Allergy and Immunology  05/25/16     Chief Complaint  Patient presents with  . Follow-up    Vit D     Subjective: Pt presents for an OV for follow up on vit d deficiency.   Vit d deficiency: She has been taking her OTC vit d 2000 u and finished her prescribed 50000K weekly ergocalciferol. Last levels 05/2018 1- 12, repeat 08/30/2018-  51. She has tolerated supplement well.     Depression screen [facility] 2/9 06/10/2018 04/29/2018 06/07/2017 05/25/2016  Decreased Interest 0 0 0 0  Down, Depressed, Hopeless 0 0 0 0  PHQ - 2 Score 0 0 0 0    Allergies  Allergen Reactions  . Zithromax [Azithromycin] Hives   Social History   Tobacco Use  . Smoking status: Never Smoker  . Smokeless tobacco: Never Used  Substance Use Topics  . Alcohol use: Yes    Alcohol/week: 2.0 standard drinks    Types: 2 Glasses of wine per week   Past Medical History:  Diagnosis Date  . Allergy    Dr. Allyson Sabal  . Depression   . Eczema    Dr. Allyson Sabal   Past Surgical History:  Procedure Laterality Date  . CYST EXCISION     eyelids, Dr. Gershon Crane   Family History  Problem Relation Age of Onset  . GER disease Brother   . Cancer Neg Hx    Allergies as of 09/03/2018      Reactions   Zithromax [azithromycin] Hives      Medication List        Accurate as of 09/03/18  8:37 AM. Always use your most recent med list.          Diclofenac Sodium 2 % Soln Place 1 application onto the skin 2 (two) times daily.   fluticasone 50 MCG/ACT nasal spray Commonly known as:  FLONASE Place 2 sprays into both nostrils daily.   levocetirizine 5 MG tablet Commonly known as:  XYZAL Take 1 tablet (5 mg total) by mouth every evening.       All past medical history, surgical history,  allergies, family history, immunizations andmedications were updated in the EMR today and reviewed under the history and medication portions of their EMR.     ROS: Negative, with the exception of above mentioned in HPI   Objective:  BP 116/80 (BP Location: Right Arm, Patient Position: Sitting, Cuff Size: Large)   Pulse (!) 56   Temp 98.7 F (37.1 C)   Resp 20   Ht 5' 2"  (1.575 m)   Wt 177 lb 3.2 oz (80.4 kg)   LMP 08/05/2018   SpO2 99%   BMI 32.41 kg/m  Body mass index is 32.41 kg/m. Gen: Afebrile. No acute distress. Nontoxic in appearance, well developed, well nourished.  HENT: AT. [place].  MMM Eyes:Pupils Equal Round Reactive to light, Extraocular movements intact,  Conjunctiva without redness, discharge or icterus. CV: RRR  Chest: CTAB, no wheeze or crackles.  Neuro:  Normal gait. PERLA. EOMi. Alert. Oriented x3  Psych: Normal affect, dress and demeanor. Normal speech. Normal thought content and judgment.  No exam data present No results found. No results found for this or any previous visit (from the past 24 hour(s)).  Assessment/Plan: Sheila Haw  ETHELYN Nunez is a 42 y.o. female present for OV for  Vitamin D deficiency - levels now normal. Reviewed lab results in person with patient. Vit D Levels 12--> 51.  Recommended continue daily vit d 2000u with food to maintain levels.  - discussed osteoporosis prevention/bone health. Weight bearing exercises, adequate vitd and calcium.  - will follow during physicals yearly.    Reviewed expectations re: course of current medical issues.  Discussed self-management of symptoms.  Outlined signs and symptoms indicating need for more acute intervention.  Patient verbalized understanding and all questions were answered.  Patient received an After-Visit Summary.   > 15 minutes spent with patient, >50% of time spent face to face    No orders of the defined types were placed in this encounter.    Note is dictated utilizing voice  recognition software. Although note has been proof read prior to signing, occasional typographical errors still can be missed. If any questions arise, please do not hesitate to call for verification.   electronically signed by:  Howard Pouch, DO  [place]

## 2018-09-20 ENCOUNTER — Ambulatory Visit
Admission: RE | Admit: 2018-09-20 | Discharge: 2018-09-20 | Disposition: A | Discharge status: Home / Self Care | Payer: BLUE CROSS/BLUE SHIELD | Source: Ambulatory Visit | Attending: Family Medicine | Admitting: Family Medicine

## 2018-09-20 DIAGNOSIS — Z1231 Encounter for screening mammogram for malignant neoplasm of breast: Secondary | ICD-10-CM | POA: Diagnosis not present

## 2018-10-23 ENCOUNTER — Encounter: Payer: Self-pay | Admitting: Sports Medicine

## 2018-10-23 ENCOUNTER — Ambulatory Visit: Payer: BLUE CROSS/BLUE SHIELD | Admitting: Sports Medicine

## 2018-10-23 ENCOUNTER — Ambulatory Visit: Payer: Self-pay

## 2018-10-23 ENCOUNTER — Ambulatory Visit (INDEPENDENT_AMBULATORY_CARE_PROVIDER_SITE_OTHER): Payer: BLUE CROSS/BLUE SHIELD

## 2018-10-23 VITALS — BP 118/80 | HR 63 | Ht 62.0 in | Wt 183.0 lb

## 2018-10-23 DIAGNOSIS — R29898 Other symptoms and signs involving the musculoskeletal system: Secondary | ICD-10-CM | POA: Diagnosis not present

## 2018-10-23 DIAGNOSIS — G5711 Meralgia paresthetica, right lower limb: Secondary | ICD-10-CM

## 2018-10-23 DIAGNOSIS — M6788 Other specified disorders of synovium and tendon, other site: Secondary | ICD-10-CM | POA: Diagnosis not present

## 2018-10-23 DIAGNOSIS — M25572 Pain in left ankle and joints of left foot: Secondary | ICD-10-CM

## 2018-10-23 DIAGNOSIS — M24551 Contracture, right hip: Secondary | ICD-10-CM

## 2018-10-23 HISTORY — DX: Other specified disorders of synovium and tendon, other site: M67.88

## 2018-10-23 MED ORDER — NITROGLYCERIN 0.2 MG/HR TD PT24: MEDICATED_PATCH | TRANSDERMAL | 1 refills | Status: DC

## 2018-10-23 NOTE — Patient Instructions (Addendum)
Nitroglycerin Protocol   Apply 1/4 nitroglycerin patch to affected area daily.  Change position of patch within the affected area every 24 hours.  You may experience a headache during the first 1-2 weeks of using the patch, these should subside.  If you experience headaches after beginning nitroglycerin patch treatment, you may take your preferred over the counter pain reliever.  Another side effect of the nitroglycerin patch is skin irritation or rash related to patch adhesive.  Please notify our office if you develop more severe headaches or rash, and stop the patch.  Tendon healing with nitroglycerin patch may require 12 to 24 weeks depending on the extent of injury.  Men should not use if taking Viagra, Cialis, or Levitra.   Do not use if you have migraines or rosacea.    Please perform the exercise program that we have prepared for you and gone over in detail on a daily basis.  In addition to the handout you were provided you can access your program through: [email or web address]   Your unique program code is:  [email or web address]   I recommend you obtained a compression sleeve to help with your joint problems. There are many options on the market however I recommend obtaining a ankle Body Helix compression sleeve.  You can find information (including how to appropriate measure yourself for sizing) can be found at www.Body http://www.lambert.com/.  Many of these products are health savings account (HSA) eligible.   You can use the compression sleeve at any time throughout the day but is most important to use while being active as well as for 2 hours post-activity.   It is appropriate to ice following activity with the compression sleeve in place.

## 2018-10-23 NOTE — Progress Notes (Signed)
PROCEDURE NOTE: THERAPEUTIC EXERCISES (97110) 15 minutes spent for Therapeutic exercises as below and as referenced in the AVS.  This included exercises focusing on stretching, strengthening, with significant focus on eccentric aspects.   Proper technique shown and discussed handout in great detail with ATC.  All questions were discussed and answered.   Long term goals include an improvement in range of motion, strength, endurance as well as avoiding reinjury. Frequency of visits is one time as determined during today's  office visit. Frequency of exercises to be performed is as per handout.  EXERCISES REVIEWED:  Sheila Nunez Exercises  Hip ABduction strengthening with focus on Glute Medius Recruitment  Hip flexor stretching  4 way ANKLE exercises

## 2018-10-23 NOTE — Procedures (Signed)
LIMITED MSK ULTRASOUND OF Left ankle Images were obtained and interpreted by myself, Teresa Coombs, DO  Images have been saved and stored to PACS system. Images obtained on: GE S7 Ultrasound machine  FINDINGS:   Peroneal tendons are thickened and Tendinopathic.  At the level of the malleolus is most thickened area approximately 3-4 times size normal.  No significant tissue disruption or tear appreciated.  Minimal neovascularity.  IMPRESSION:  1. Left peroneal tendinopathy

## 2018-12-03 ENCOUNTER — Ambulatory Visit: Payer: BLUE CROSS/BLUE SHIELD | Admitting: Sports Medicine

## 2018-12-03 ENCOUNTER — Encounter: Payer: Self-pay | Admitting: Sports Medicine

## 2018-12-03 VITALS — BP 120/76 | HR 68 | Ht 62.0 in | Wt 179.8 lb

## 2018-12-03 DIAGNOSIS — M25572 Pain in left ankle and joints of left foot: Secondary | ICD-10-CM | POA: Diagnosis not present

## 2018-12-03 DIAGNOSIS — M6788 Other specified disorders of synovium and tendon, other site: Secondary | ICD-10-CM | POA: Diagnosis not present

## 2018-12-03 MED ORDER — NITROGLYCERIN 0.2 MG/HR TD PT24: MEDICATED_PATCH | TRANSDERMAL | 1 refills | Status: DC

## 2018-12-03 NOTE — Progress Notes (Signed)
Sheila Nunez. [place], [place] at [place]  MIAH BOYE - 43 y.o. female MRN [id number]  Date of birth: 26-Dec-1975  Visit Date: December 03, 2018  PCP: Ma Hillock, DO   Referred by: Ma Hillock, DO  SUBJECTIVE:  Chief Complaint  Patient presents with  . Follow-up    L ankle pain    HPI: Here today for follow-up of her left ankle pain.  She is having moderate degree of improvement with this continues to have some pain especially if she strikes her foot directly on the medial aspect of her heel.  This is considered sharp at times.  She denies any clicking, popping or giving way.  She has decreased her physical activity level.  She has been performing the nitroglycerin protocol as well as home exercise program with moderate improvement  REVIEW OF SYSTEMS: Denies fevers, chills, recent weight gain or weight loss.  No night sweats. No significant nighttime awakenings due to this issue. HISTORY:  Prior history reviewed and updated per electronic medical record.  Social History   Occupational History  . Not on file  Tobacco Use  . Smoking status: Never Smoker  . Smokeless tobacco: Never Used  Substance and Sexual Activity  . Alcohol use: Yes    Alcohol/week: 2.0 standard drinks    Types: 2 Glasses of wine per week  . Drug use: No  . Sexual activity: Yes    Partners: Male   Social History   Social History Narrative   Single. 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   Never smoker, occ etoh, no drugs.        DATA OBTAINED & REVIEWED:  Recent Labs    06/10/18 0844  HGBA1C 5.4  CALCIUM 9.0  AST 8  ALT 8   Problem  Left Peroneal Tendinosis   No specialty comments available.   OBJECTIVE:  VS:  HT:5' 2"  (157.5 cm)   WT:179 lb 12.8 oz (81.6 kg)  BMI:32.88    BP:120/76  HR:68bpm  TEMP: ( )  RESP:96 %   PHYSICAL  EXAM: Adult female.  No acute distress.  Alert and appropriate.  Left ankle is overall well aligned without significant deformity.  She has some discomfort over the medial ankle but this is mild.  Is mainly over the peroneal tendons as well as along the lateral aspect of the calcaneus.  Small amount of pain with palpation of the Achilles.  DP and PT pulses are 2+/4.   ASSESSMENT   1. Left ankle pain, unspecified chronicity   2. Left peroneal tendinosis      PROCEDURES:  None  PLAN:  Pertinent additional documentation may be included in corresponding procedure notes, imaging studies, problem based documentation and patient instructions.  Left peroneal tendinosis She is continuing to have some improvement with her symptoms and is happy with the progress that she has made although incomplete at this time.  We will have her continue with the nitroglycerin protocol, therapeutic exercises, activity modifications, Body Helix Compression Sleeve compression sleeve and reevaluate in 6 weeks.  If any persistent ongoing or worsening symptoms MRI of the ankle can be considered.    Activity modifications and the importance of avoiding exacerbating activities (limiting pain to no more than a 4 / 10 during or following activity) recommended and discussed. Discussed red flag symptoms that warrant earlier  emergent evaluation and patient voices understanding. Meds ordered this encounter  Medications  . nitroGLYCERIN (NITRODUR - DOSED IN MG/24 HR) 0.2 mg/hr patch    Sig: Place 1/4 to 1/2 of a patch over affected region. Remove and replace once daily.  Slightly alter skin placement daily    Dispense:  30 patch    Refill:  1    For musculoskeletal purposes.  Okay to cut patch.   Lab Orders  No laboratory test(s) ordered today   Imaging Orders  No imaging studies ordered today   Referral Orders  No referral(s) requested today    Return in about 6 weeks (around 01/14/2019).          [name], [facility] Physician

## 2018-12-04 ENCOUNTER — Encounter: Payer: Self-pay | Admitting: Sports Medicine

## 2018-12-04 NOTE — Assessment & Plan Note (Signed)
She is continuing to have some improvement with her symptoms and is happy with the progress that she has made although incomplete at this time.  We will have her continue with the nitroglycerin protocol, therapeutic exercises, activity modifications, Body Helix Compression Sleeve compression sleeve and reevaluate in 6 weeks.  If any persistent ongoing or worsening symptoms MRI of the ankle can be considered.

## 2018-12-26 ENCOUNTER — Encounter: Payer: Self-pay | Admitting: Sports Medicine

## 2018-12-26 NOTE — Progress Notes (Signed)
[name] Chery Giusto, [place] at [place]  MADHURI VACCA - 43 y.o. female MRN [id number]  Date of birth: [birth year]  Visit Date: December 26, 2018  PCP: Ma Hillock, DO   Referred by: Ma Hillock, DO  SUBJECTIVE:   Chief Complaint  Patient presents with  . L ankle pain    Kickboxer. Pain started around 3 mos ago. Pain is 6-7/10 when walking/wearing heels. Sx worse with flexion, wt bearing. Improve with rest. Described as sharp pain radiating up the back of the ankle, also lateral. Denies swelling n/t.   . Numbness in R thigh    feels like leg has "gone to sleep". Worse with standing, sleeping, working out. Feels like R thigh is larger than L.    HPI: Patient presents with the above symptoms.  It is been progressively worsening since kickboxing.  She reports when she steps back with her left ankle this is when the pain is most severe.  She also reports feeling as though when she is in this position she is overloading the leg.  She has some associated numbness in her right thigh.  REVIEW OF SYSTEMS: Denies fevers, chills, recent weight gain or weight loss.  No night sweats. She does report some nighttime disturbances especially when she is laying on her stomach.  She causes the right leg to hurt more.  She also has some right upper extremity numbness and tingling with overhead reaching while at night. Pt denies any change in bowel or bladder habits, muscle weakness, numbness or falls associated with this pain.  HISTORY:  Prior history reviewed and updated per electronic medical record.  Patient Active Problem List   Diagnosis Date Noted  . 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 Ganglion cyst of dorsum of right wrist 05/02/2018    X-rays right wrist on 05/02/2018 are unremarkable   . Obesity (BMI 30-39.9) 06/07/2017  . Vitamin D deficiency 05/25/2016  . 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 Allergic rhinitis 02/03/2014    Lipitor allergy, asthma and sinus care. Reviewed note 01/06/2016, seen for allergic rhinitis, atopic dermatitis. Continue Flonase, allergen avoidance, nasal saline continue  Continue fluccinonide 0.05% ont. Skin testing to be completed nextappt.     Social History   Occupational History  . Not on file  Tobacco Use  . Smoking status: Never Smoker  . Smokeless tobacco: Never Used  Substance and Sexual Activity  . Alcohol use: Yes    Alcohol/week: 2.0 standard drinks    Types: 2 Glasses of wine per week  . Drug use: No  . Sexual activity: Yes    Partners: Male   Social History   Social History Narrative   Single. 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   Never smoker, occ etoh, no drugs.        OBJECTIVE:  VS:  HT:5' 2"  (157.5 cm)   WT:183 lb (83 kg)  BMI:33.46    BP:118/80  HR:63bpm  TEMP: ( )  RESP:97 %   PHYSICAL EXAM: CONSTITUTIONAL: Well-developed, Well-nourished and In no acute distress EYES: Pupils are equal., EOM intact without nystagmus. and No scleral icterus. Psychiatric: Alert & appropriately interactive. and Not depressed or anxious appearing. EXTREMITY EXAM: Warm and well perfused  Left ankle is overall well aligned.  She has no significant deformity.  There is a slight fullness of the left peroneal tendons with no significant ankle swelling.  She does have pain with resisted ankle eversion.  Normal dorsiflexion, plantarflexion strength but poor proprioception.  Her right hip has an anterior rotation with markedly tight hip flexors.  She has a slightly positive Tinel's over the inguinal ligament causing radiating pain into the right thigh.  She is able heel toe walk without significant difficulty.   ASSESSMENT:   1. Left ankle pain, unspecified chronicity   2. Meralgia paresthetica of right side   3. Left peroneal tendinosis   4.  Weakness of right hip   5. Right hip flexor tightness     PROCEDURES:  Home Therapeutic exercises prescribed per procedure note.      PLAN:  Pertinent additional documentation may be included in corresponding procedure notes, imaging studies, problem based documentation and patient instructions.  No problem-specific Assessment & Plan notes found for this encounter.   Patient does have symptoms consistent with meralgia paresthetica of the right leg and thigh and this is likely secondary to a markedly tight anterior chain on the right.  Home therapeutic exercises including foundations training recommended at this time.  Ultimately she has peroneal tendinopathy as well and will benefit from nitroglycerin protocol and Body Helix Compression Sleeve compression sleeve.  Links to [employer] provided today per Patient Instructions.  These exercises were developed by Minerva Ends, DC with a strong emphasis on core neuromuscular reducation and postural realignment through body-weight exercises.  and Home Therapeutic exercises prescribed today per procedure note.  TENDINOPATHY - Discussed that the anticipated amount of time for healing is 12- 24 weeks for Tendinopathic changes.  Emphasized the importance of improving blood flow as well as eccentric loading of the tendon.  NITRO PROTOCOL - Discussed options with the patient today including biologic treatment with topical nitroglycerin. Patient has no contraindications & understands the risks, benefits and intentions of treatment. Emphasized the importance of rotating sites as well as appropriate and expected adverse reactions including orthostasis, headache, adhesive sensitivity.    Activity modifications and the importance of avoiding exacerbating activities (limiting pain to no more than a 4 / 10 during or following activity) recommended and discussed.  Discussed red flag symptoms that warrant earlier emergent evaluation and  patient voices understanding.   Meds ordered this encounter  Medications  . DISCONTD: nitroGLYCERIN (NITRODUR - DOSED IN MG/24 HR) 0.2 mg/hr patch    Sig: Place 1/4 to 1/2 of a patch over affected region. Remove and replace once daily.  Slightly alter skin placement daily    Dispense:  30 patch    Refill:  1    For musculoskeletal purposes.  Okay to cut patch.   Lab Orders  No laboratory test(s) ordered today   Imaging Orders     DG Ankle Complete Left     [place] MSK POCT ULTRASOUND Referral Orders  No referral(s) requested today    Return in about 6 weeks (around 12/04/2018).          [name], [facility] Physician

## 2018-12-31 DIAGNOSIS — J3081 Allergic rhinitis due to animal (cat) (dog) hair and dander: Secondary | ICD-10-CM | POA: Diagnosis not present

## 2018-12-31 DIAGNOSIS — L209 Atopic dermatitis, unspecified: Secondary | ICD-10-CM | POA: Diagnosis not present

## 2018-12-31 DIAGNOSIS — J3089 Other allergic rhinitis: Secondary | ICD-10-CM | POA: Diagnosis not present

## 2018-12-31 DIAGNOSIS — H1045 Other chronic allergic conjunctivitis: Secondary | ICD-10-CM | POA: Diagnosis not present

## 2019-01-14 ENCOUNTER — Encounter: Payer: Self-pay | Admitting: Sports Medicine

## 2019-01-14 ENCOUNTER — Ambulatory Visit: Payer: BLUE CROSS/BLUE SHIELD | Admitting: Sports Medicine

## 2019-01-14 VITALS — BP 112/80 | HR 79 | Ht 62.0 in | Wt 187.6 lb

## 2019-01-14 DIAGNOSIS — M6788 Other specified disorders of synovium and tendon, other site: Secondary | ICD-10-CM

## 2019-01-14 DIAGNOSIS — M25572 Pain in left ankle and joints of left foot: Secondary | ICD-10-CM | POA: Diagnosis not present

## 2019-01-14 NOTE — Progress Notes (Signed)
[name], [name]  Sheila Nunez - 43 y.o. female MRN [id number]  Date of birth: [birth year]  Visit Date: January 14, 2019  PCP: Ma Hillock, DO   Referred by: Ma Hillock, DO  SUBJECTIVE:  Chief Complaint  Patient presents with  . Left Ankle - Follow-up    XR L ankle 10/23/18. Nitro Protocol, HEP, compression.     HPI: Patient is here for follow-up of her left medial ankle pain.  She is having overall good improvement in her symptoms.  She denies any swelling numbness or weakness.  She continues to have pressure especially with heel strike first thing in the morning.  She has been continue to use the nitroglycerin protocol.  Using compression on a daily basis.  Continues to do her home exercises but very infrequently.  REVIEW OF SYSTEMS: No significant nighttime awakenings due to this issue. Denies fevers, chills, recent weight gain or weight loss.  No night sweats.  Pt denies any change in bowel or bladder habits, muscle weakness, numbness or falls associated with this pain.  HISTORY:  Prior history reviewed and updated per electronic medical record.  Patient Active Problem List   Diagnosis Date Noted  . 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 Ganglion cyst of dorsum of right wrist 05/02/2018    X-rays right wrist on 05/02/2018 are unremarkable   . Obesity (BMI 30-39.9) 06/07/2017  . Vitamin D deficiency 05/25/2016  . 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 Allergic rhinitis 02/03/2014    Lipitor allergy, asthma and sinus care. Reviewed note 01/06/2016, seen for allergic rhinitis, atopic dermatitis. Continue Flonase, allergen avoidance, nasal saline continue  Continue fluccinonide 0.05% ont. Skin testing to be completed nextappt.     Social History   Occupational History  . Not on file  Tobacco Use  . Smoking status: Never Smoker  . Smokeless  tobacco: Never Used  Substance and Sexual Activity  . Alcohol use: Yes    Alcohol/week: 2.0 standard drinks    Types: 2 Glasses of wine per week  . Drug use: No  . Sexual activity: Yes    Partners: Male   Social History   Social History Narrative   Single. 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   Never smoker, occ etoh, no drugs.        OBJECTIVE:  VS:  HT:5' 2"  (157.5 cm)   WT:187 lb 9.6 oz (85.1 kg)  BMI:34.3    BP:112/80  HR:79bpm  TEMP: ( )  RESP:97 %   PHYSICAL EXAM: Adult female. No acute distress.  Alert and appropriate. Left ankle is overall well aligned without significant only.  She has mild pain with palpation of the peroneal tendons.  Good ankle dorsiflexion, plantarflexion, inversion and eversion strength.  Ankle drawer testing is stable.  No pain over the syndesmosis.  There is a slight amount of swelling over the peroneal tendons compared to the contralateral side.   ASSESSMENT:   1. Left ankle pain, unspecified chronicity   2. Left peroneal tendinosis     PROCEDURES:  None  PLAN:  Pertinent additional documentation may be included in corresponding procedure notes, imaging studies, problem based documentation and patient instructions.  No problem-specific Assessment & Plan notes found for this encounter.   Overall she is better but  incompletely healed.  Recommend continue with compression, nitroglycerin protocol, home therapeutic exercises and begin increasing her physical activity levels.  We cautioned on avoiding dynamic side to side motions.  Is okay to return to boxing activities but not kickboxing.  Straightahead walking and weightbearing activities can slowly be reintroduced.  Continue previously prescribed home exercise program.   Activity modifications and the importance of avoiding exacerbating activities (limiting pain to no more than a 4 / 10 during or following  activity) recommended and discussed.  Discussed red flag symptoms that warrant earlier emergent evaluation and patient voices understanding.   No orders of the defined types were placed in this encounter.  Lab Orders  No laboratory test(s) ordered today   Imaging Orders  No imaging studies ordered today   Referral Orders  No referral(s) requested today    Return in about 8 weeks (around 03/11/2019) for L ankle.  If any lack of improvement will consider MRI of the ankle.         [name], [facility] Physician

## 2019-03-11 ENCOUNTER — Ambulatory Visit: Payer: Self-pay

## 2019-03-11 ENCOUNTER — Other Ambulatory Visit: Payer: Self-pay

## 2019-03-11 ENCOUNTER — Encounter: Payer: Self-pay | Admitting: Sports Medicine

## 2019-03-11 ENCOUNTER — Ambulatory Visit: Payer: BLUE CROSS/BLUE SHIELD | Admitting: Sports Medicine

## 2019-03-11 VITALS — BP 120/72 | HR 68 | Temp 98.3°F | Ht 62.0 in | Wt 190.0 lb

## 2019-03-11 DIAGNOSIS — M6788 Other specified disorders of synovium and tendon, other site: Secondary | ICD-10-CM | POA: Diagnosis not present

## 2019-03-11 DIAGNOSIS — M7752 Other enthesopathy of left foot: Secondary | ICD-10-CM

## 2019-03-11 DIAGNOSIS — G8929 Other chronic pain: Secondary | ICD-10-CM | POA: Diagnosis not present

## 2019-03-11 DIAGNOSIS — M25572 Pain in left ankle and joints of left foot: Secondary | ICD-10-CM | POA: Diagnosis not present

## 2019-03-11 DIAGNOSIS — M76822 Posterior tibial tendinitis, left leg: Secondary | ICD-10-CM | POA: Diagnosis not present

## 2019-03-11 NOTE — Progress Notes (Signed)
Sheila Nunez. , [place] at [place]  Sheila Nunez - 43 y.o. female MRN [id number]  Date of birth: 1976-08-23  Visit Date: March 11, 2019  PCP: Ma Hillock, DO   Referred by: Ma Hillock, DO  SUBJECTIVE:   Chief Complaint  Patient presents with  . Follow-up    L ankle and peroneal tendinopathy.  Using nitro patches and wearing Body Helix.  Given HEP    HPI: Patient reports overall she is doing better than in the past and has been able to return to walking twice a day and doing plyometric-based workouts.  Some there is activity limitations are due to the COVID-19 restrictions.  She is been unable to return to kickboxing at this time due to these restrictions.  She is continue with the nitroglycerin protocol.  She reports having more pain along the Achilles and posterior aspect of the medial malleolus.  She has been performing her home therapeutic exercises 4-5 times per week.  Continue to use the Body Helix Compression Sleeve and nitroglycerin protocol without side effects.  REVIEW OF SYSTEMS: No significant nighttime awakenings due to this issue. Denies fevers, chills, recent weight gain or weight loss.  No night sweats.  Pt denies any change in bowel or bladder habits, muscle weakness, numbness or falls associated with this pain.  HISTORY:  Prior history reviewed and updated per electronic medical record.  Patient Active Problem List   Diagnosis Date Noted  . 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 Ganglion cyst of dorsum of right wrist 05/02/2018    X-rays right wrist on 05/02/2018 are unremarkable   . Obesity (BMI 30-39.9) 06/07/2017  . Vitamin D deficiency 05/25/2016  . 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 Allergic rhinitis 02/03/2014    Lipitor allergy, asthma and sinus care. Reviewed note 01/06/2016, seen for allergic rhinitis, atopic dermatitis. Continue Flonase,  allergen avoidance, nasal saline continue  Continue fluccinonide 0.05% ont. Skin testing to be completed nextappt.     Social History   Occupational History  . Not on file  Tobacco Use  . Smoking status: Never Smoker  . Smokeless tobacco: Never Used  Substance and Sexual Activity  . Alcohol use: Yes    Alcohol/week: 2.0 standard drinks    Types: 2 Glasses of wine per week  . Drug use: No  . Sexual activity: Yes    Partners: Male   Social History   Social History Narrative   Single. 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   Never smoker, occ etoh, no drugs.        OBJECTIVE:  VS:  HT:5' 2"  (157.5 cm)   WT:190 lb (86.2 kg)  BMI:34.74    BP:120/72  HR:68bpm  TEMP:98.3 F (36.8 C)( )  RESP:97 %   PHYSICAL EXAM: Adult female. No acute distress.  Alert and appropriate. Left ankle is overall well aligned without significant swelling deformity or discoloration.  No significant skin changes.  Ankle drawer testing is stable.  Intrinsic ankle strength and range of motion are intact to dorsiflexion, plantarflexion, inversion and eversion.  He has pain with palpation over the retrocalcaneal area.  No pain with palpation directly along the Achilles tendon.  Mild pain with palpation along the posterior tibialis tendon and at the level of the medial malleolus there is mild pain.  No pain with palpation over the peroneal tendons.   ASSESSMENT:   1. Chronic pain of left ankle   2. Left peroneal tendinosis   3. Retrocalcaneal bursitis (back of heel), left   4. Posterior tibial tendinitis, left     PROCEDURES:  None  PLAN:  Pertinent additional documentation may be included in corresponding procedure notes, imaging studies, problem based documentation and patient instructions.  No problem-specific Assessment & Plan notes found for this encounter.   She is previously been prescribed Pennsaid for her  wrist and will begin using this on her ankle twice a day for the next 2 weeks.  Cool water soaking recommended after activities.  Continue with Body Helix Compression Sleeve but cautioned on using this in close proximity to the Pennsaid.  Home Therapeutic Exercises: Continue previously prescribed home exercise program   Activity modifications and the importance of avoiding exacerbating activities (limiting pain to no more than a 4 / 10 during or following activity) recommended and discussed.   Discussed red flag symptoms that warrant earlier emergent evaluation and patient voices understanding.    No orders of the defined types were placed in this encounter.  Lab Orders  No laboratory test(s) ordered today    Imaging Orders     [place] MSK POCT ULTRASOUND Referral Orders  No referral(s) requested today    Return if symptoms worsen or fail to improve.          [name], [facility] Physician

## 2019-03-11 NOTE — Patient Instructions (Signed)
Frequent icing is recommended.  You can ice for 10-15 minutes at a time 3-4 times per day if needed.  It is important to be sure to ice immediately after activity. For the foot/ankle and hand/wrist it is sometimes easier and more effective to soak in a bucket of cool water.   The water should not be miserably cold, a good rule to go by his if there is any ice floating by the end of the 10-15 minutes it was likely too cold.   Start using your Pennsaid on your foot/ankle twice a day for 2 weeks.

## 2019-03-11 NOTE — Procedures (Signed)
LIMITED MSK ULTRASOUND OF Left ankle Images were obtained and interpreted by myself, Teresa Coombs, DO  Images have been saved and stored to PACS system. Images obtained on: GE S7 Ultrasound machine  FINDINGS:   Achilles is overall well aligned with linear formation without fusiform swelling.  There is no peri-tendinosis.  She does have a small amount of hypoechoic change within the retrocalcaneal bursa.  Her left posterior tibialis tendons are intact however there is a moderate amount of fluid with a positive halo sign.  Peroneal tendons are normal-appearing  IMPRESSION:  1. Small amount of left-sided retrocalcaneal bursitis with posterior tibialis tendinitis.

## 2019-06-17 ENCOUNTER — Encounter: Payer: Self-pay | Admitting: Family Medicine

## 2019-06-17 ENCOUNTER — Other Ambulatory Visit: Payer: Self-pay

## 2019-06-17 ENCOUNTER — Ambulatory Visit (INDEPENDENT_AMBULATORY_CARE_PROVIDER_SITE_OTHER): Payer: BC Managed Care – PPO | Admitting: Family Medicine

## 2019-06-17 VITALS — BP 114/81 | HR 62 | Temp 98.3°F | Resp 15 | Ht 62.6 in | Wt 192.0 lb

## 2019-06-17 DIAGNOSIS — E559 Vitamin D deficiency, unspecified: Secondary | ICD-10-CM

## 2019-06-17 DIAGNOSIS — Z0000 Encounter for general adult medical examination without abnormal findings: Secondary | ICD-10-CM | POA: Diagnosis not present

## 2019-06-17 DIAGNOSIS — Z130 Encounter for screening for diseases of the blood and blood-forming organs and certain disorders involving the immune mechanism: Secondary | ICD-10-CM | POA: Diagnosis not present

## 2019-06-17 DIAGNOSIS — N926 Irregular menstruation, unspecified: Secondary | ICD-10-CM

## 2019-06-17 DIAGNOSIS — Z79899 Other long term (current) drug therapy: Secondary | ICD-10-CM | POA: Diagnosis not present

## 2019-06-17 DIAGNOSIS — Z131 Encounter for screening for diabetes mellitus: Secondary | ICD-10-CM

## 2019-06-17 DIAGNOSIS — E669 Obesity, unspecified: Secondary | ICD-10-CM

## 2019-06-17 DIAGNOSIS — Z1239 Encounter for other screening for malignant neoplasm of breast: Secondary | ICD-10-CM

## 2019-06-17 LAB — LIPID PANEL
Cholesterol: 194 mg/dL (ref 0–200)
HDL: 51 mg/dL (ref >39.00)
LDL Cholesterol: 129 mg/dL — ABNORMAL HIGH (ref 0–99)
NonHDL: 142.74
Total CHOL/HDL Ratio: 4
Triglycerides: 69 mg/dL (ref 0.0–149.0)
VLDL: 13.8 mg/dL (ref 0.0–40.0)

## 2019-06-17 LAB — COMPREHENSIVE METABOLIC PANEL
ALT: 9 U/L (ref 0–35)
AST: 11 U/L (ref 0–37)
Albumin: 4.2 g/dL (ref 3.5–5.2)
Alkaline Phosphatase: 69 U/L (ref 39–117)
BUN: 5 mg/dL — ABNORMAL LOW (ref 6–23)
CO2: 30 mEq/L (ref 19–32)
Calcium: 9.3 mg/dL (ref 8.4–10.5)
Chloride: 105 mEq/L (ref 96–112)
Creatinine, Ser: 0.72 mg/dL (ref 0.40–1.20)
GFR: 107.16 mL/min (ref >60.00)
Glucose, Bld: 88 mg/dL (ref 70–99)
Potassium: 4.2 mEq/L (ref 3.5–5.1)
Sodium: 140 mEq/L (ref 135–145)
Total Bilirubin: 0.6 mg/dL (ref 0.2–1.2)
Total Protein: 7.1 g/dL (ref 6.0–8.3)

## 2019-06-17 LAB — CBC WITH DIFFERENTIAL/PLATELET
Basophils Absolute: 0 10*3/uL (ref 0.0–0.1)
Basophils Relative: 0.6 % (ref 0.0–3.0)
Eosinophils Absolute: 0.1 10*3/uL (ref 0.0–0.7)
Eosinophils Relative: 1 % (ref 0.0–5.0)
HCT: 41.9 % (ref 36.0–46.0)
Hemoglobin: 13.9 g/dL (ref 12.0–15.0)
Lymphocytes Relative: 32 % (ref 12.0–46.0)
Lymphs Abs: 1.8 10*3/uL (ref 0.7–4.0)
MCHC: 33.2 g/dL (ref 30.0–36.0)
MCV: 88.8 fl (ref 78.0–100.0)
Monocytes Absolute: 0.3 10*3/uL (ref 0.1–1.0)
Monocytes Relative: 6 % (ref 3.0–12.0)
Neutro Abs: 3.3 10*3/uL (ref 1.4–7.7)
Neutrophils Relative %: 60.4 % (ref 43.0–77.0)
Platelets: 191 10*3/uL (ref 150.0–400.0)
RBC: 4.72 Mil/uL (ref 3.87–5.11)
RDW: 12.7 % (ref 11.5–15.5)
WBC: 5.5 10*3/uL (ref 4.0–10.5)

## 2019-06-17 LAB — HEMOGLOBIN A1C: Hgb A1c MFr Bld: 5.4 % (ref 4.6–6.5)

## 2019-06-17 LAB — VITAMIN D 25 HYDROXY (VIT D DEFICIENCY, FRACTURES): VITD: 23.5 ng/mL — ABNORMAL LOW (ref 30.00–100.00)

## 2019-06-17 LAB — TSH: TSH: 1.7 u[IU]/mL (ref 0.35–4.50)

## 2019-06-17 NOTE — Patient Instructions (Addendum)
Body mass index is 34.45 kg/m.   BMI for Adults  Body mass index (BMI) is a number that is calculated from a person's weight and height. BMI may help to estimate how much of a person's weight is composed of fat. BMI can help identify those who may be at higher risk for certain medical problems. How is BMI used with adults? BMI is used as a screening tool to identify possible weight problems. It is used to check whether a person is obese, overweight, healthy weight, or underweight. How is BMI calculated? BMI measures your weight and compares it to your height. This can be done either in [place] ([place] or metric measurements. Note that charts are available to help you find your BMI quickly and easily without having to do these calculations yourself. To calculate your BMI in [place] ([place] measurements, your health care provider will: 1. Measure your weight in pounds (lb). 2. Multiply the number of pounds by 703. ? For example, for a person who weighs 180 lb, multiply that number by 703, which equals 126,540. 3. Measure your height in inches (in). Then multiply that number by itself to get a measurement called "inches squared." ? For example, for a person who is 70 in tall, the "inches squared" measurement is 70 in x 70 in, which equals 4900 inches squared. 4. Divide the total from Step 2 (number of lb x 703) by the total from Step 3 (inches squared): 126,540  4900 = 25.8. This is your BMI. To calculate your BMI in metric measurements, your health care provider will: 1. Measure your weight in kilograms (kg). 2. Measure your height in meters (m). Then multiply that number by itself to get a measurement called "meters squared." ? For example, for a person who is 1.75 m tall, the "meters squared" measurement is 1.75 m x 1.75 m, which is equal to 3.1 meters squared. 3. Divide the number of kilograms (your weight) by the meters squared number. In this example: 70  3.1 = 22.6. This is your BMI. How is  BMI interpreted? To interpret your results, your health care provider will use BMI charts to identify whether you are underweight, normal weight, overweight, or obese. The following guidelines will be used:  Underweight: BMI less than 18.5.  Normal weight: BMI between 18.5 and 24.9.  Overweight: BMI between 25 and 29.9.  Obese: BMI of 30 and above. Please note:  Weight includes both fat and muscle, so someone with a muscular build, such as an athlete, may have a BMI that is higher than 24.9. In cases like these, BMI is not an accurate measure of body fat.  To determine if excess body fat is the cause of a BMI of 25 or higher, further assessments may need to be done by a health care provider.  BMI is usually interpreted in the same way for men and women. Why is BMI a useful tool? BMI is useful in two ways:  Identifying a weight problem that may be related to a medical condition, or that may increase the risk for medical problems.  Promoting lifestyle and diet changes in order to reach a healthy weight. Summary  Body mass index (BMI) is a number that is calculated from a person's weight and height.  BMI may help to estimate how much of a person's weight is composed of fat. BMI can help identify those who may be at higher risk for certain medical problems.  BMI can be measured using English measurements or  metric measurements.  To interpret your results, your health care provider will use BMI charts to identify whether you are underweight, normal weight, overweight, or obese. This information is not intended to replace advice given to you by your health care provider. Make sure you discuss any questions you have with your health care provider. Document Released: 07/18/2004 Document Revised: 10/19/2017 Document Reviewed: 09/19/2017 Elsevier Patient Education  2020 [place] Maintenance, Female Adopting a healthy lifestyle and getting preventive care are important in  promoting health and wellness. Ask your health care provider about:  The right schedule for you to have regular tests and exams.  Things you can do on your own to prevent diseases and keep yourself healthy. What should I know about diet, weight, and exercise? Eat a healthy diet   Eat a diet that includes plenty of vegetables, fruits, low-fat dairy products, and lean protein.  Do not eat a lot of foods that are high in solid fats, added sugars, or sodium. Maintain a healthy weight Body mass index (BMI) is used to identify weight problems. It estimates body fat based on height and weight. Your health care provider can help determine your BMI and help you achieve or maintain a healthy weight. Get regular exercise Get regular exercise. This is one of the most important things you can do for your health. Most adults should:  Exercise for at least 150 minutes each week. The exercise should increase your heart rate and make you sweat (moderate-intensity exercise).  Do strengthening exercises at least twice a week. This is in addition to the moderate-intensity exercise.  Spend less time sitting. Even light physical activity can be beneficial. Watch cholesterol and blood lipids Have your blood tested for lipids and cholesterol at 43 years of age, then have this test every 5 years. Have your cholesterol levels checked more often if:  Your lipid or cholesterol levels are high.  You are older than 43 years of age.  You are at high risk for heart disease. What should I know about cancer screening? Depending on your health history and family history, you may need to have cancer screening at various ages. This may include screening for:  Breast cancer.  Cervical cancer.  Colorectal cancer.  Skin cancer.  Lung cancer. What should I know about heart disease, diabetes, and high blood pressure? Blood pressure and heart disease  High blood pressure causes heart disease and increases the  risk of stroke. This is more likely to develop in people who have high blood pressure readings, are of African descent, or are overweight.  Have your blood pressure checked: ? Every 3-5 years if you are 75-65 years of age. ? Every year if you are 57 years old or older. Diabetes Have regular diabetes screenings. This checks your fasting blood sugar level. Have the screening done:  Once every three years after age 23 if you are at a normal weight and have a low risk for diabetes.  More often and at a younger age if you are overweight or have a high risk for diabetes. What should I know about preventing infection? Hepatitis B If you have a higher risk for hepatitis B, you should be screened for this virus. Talk with your health care provider to find out if you are at risk for hepatitis B infection. Hepatitis C Testing is recommended for:  Everyone born from [birth year] through 1965.  Anyone with known risk factors for hepatitis C. Sexually transmitted infections (STIs)  Get screened for STIs, including gonorrhea and chlamydia, if: ? You are sexually active and are younger than 43 years of age. ? You are older than 43 years of age and your health care provider tells you that you are at risk for this type of infection. ? Your sexual activity has changed since you were last screened, and you are at increased risk for chlamydia or gonorrhea. Ask your health care provider if you are at risk.  Ask your health care provider about whether you are at high risk for HIV. Your health care provider may recommend a prescription medicine to help prevent HIV infection. If you choose to take medicine to prevent HIV, you should first get tested for HIV. You should then be tested every 3 months for as long as you are taking the medicine. Pregnancy  If you are about to stop having your period (premenopausal) and you may become pregnant, seek counseling before you get pregnant.  Take 400 to 800 micrograms (mcg) of  folic acid every day if you become pregnant.  Ask for birth control (contraception) if you want to prevent pregnancy. Osteoporosis and menopause Osteoporosis is a disease in which the bones lose minerals and strength with aging. This can result in bone fractures. If you are 67 years old or older, or if you are at risk for osteoporosis and fractures, ask your health care provider if you should:  Be screened for bone loss.  Take a calcium or vitamin D supplement to lower your risk of fractures.  Be given hormone replacement therapy (HRT) to treat symptoms of menopause. Follow these instructions at home: Lifestyle  Do not use any products that contain nicotine or tobacco, such as cigarettes, e-cigarettes, and chewing tobacco. If you need help quitting, ask your health care provider.  Do not use street drugs.  Do not share needles.  Ask your health care provider for help if you need support or information about quitting drugs. Alcohol use  Do not drink alcohol if: ? Your health care provider tells you not to drink. ? You are pregnant, may be pregnant, or are planning to become pregnant.  If you drink alcohol: ? Limit how much you use to 0-1 drink a day. ? Limit intake if you are breastfeeding.  Be aware of how much alcohol is in your drink. In the [place]., one drink equals one 12 oz bottle of beer (355 mL), one 5 oz glass of wine (148 mL), or one 1 oz glass of hard liquor (44 mL). General instructions  Schedule regular health, dental, and eye exams.  Stay current with your vaccines.  Tell your health care provider if: ? You often feel depressed. ? You have ever been abused or do not feel safe at home. Summary  Adopting a healthy lifestyle and getting preventive care are important in promoting health and wellness.  Follow your health care provider's instructions about healthy diet, exercising, and getting tested or screened for diseases.  Follow your health care provider's  instructions on monitoring your cholesterol and blood pressure. This information is not intended to replace advice given to you by your health care provider. Make sure you discuss any questions you have with your health care provider. Document Released: 05/22/2011 Document Revised: 10/30/2018 Document Reviewed: 10/30/2018 Elsevier Patient Education  2020 [employer].

## 2019-06-17 NOTE — Progress Notes (Signed)
Patient ID: Sheila Nunez, female  DOB: 05-05-1976, 43 y.o.   MRN: [id number] Patient Care Team    Relationship Specialty Notifications Start End  Ma Hillock, DO PCP - General Family Medicine  07/19/15   Tiajuana Amass, MD Referring Physician Allergy and Immunology  05/25/16   [name], DO Consulting Physician Sports Medicine  05/06/18     Chief Complaint  Patient presents with  . Annual Exam    fasting. mamm 09/2018 last pap was 08/09/2016    Subjective:  Sheila Nunez is a 43 y.o.  Female  present for CPE. All past medical history, surgical history, allergies, family history, immunizations, medications and social history were updated in the electronic medical record today. All recent labs, ED visits and hospitalizations within the last year were reviewed.  Well women: Menstrual cyclesapproximately every 24-26 days, her mensesstillare not heavy, last about 4-5 days daysor less. Denies vaginal discharge, irritation or compliants. Shedoes not routinelyperforms SBE exams. Patient's last menstrual period was 06/16/2019 (exact date).  Health maintenance: Colonoscopy:No Fhx screen at 52 Mammogram: No fhx. Completed 09/20/2018. [facility]Rads 1. Breast center.  Cervical cancer screening:08/09/2016. last papcompleted here,results: normal/neg co test. 3-5 year rpt. Immunizations: tdap 06/2014, Influenza--> allergic Infectious disease screening: HIV completed2014 DEXA:N/A Assistive device: none Oxygen CBJ:SEGB Patient has a Dental home. Hospitalizations/ED visits: reviewed  Depression screen [facility] 2/9 06/17/2019 06/10/2018 04/29/2018 06/07/2017 05/25/2016  Decreased Interest 0 0 0 0 0  Down, Depressed, Hopeless 0 0 0 0 0  PHQ - 2 Score 0 0 0 0 0   No flowsheet data found.   Immunization History  Administered Date(s) Administered  . Tdap 07/14/2014     Past Medical History:  Diagnosis Date  . Allergy    Dr. Allyson Sabal  . Depression   . Eczema    Dr. Allyson Sabal    Allergies  Allergen Reactions  . Zithromax [Azithromycin] Hives   Past Surgical History:  Procedure Laterality Date  . CYST EXCISION     eyelids, Dr. Gershon Crane   Family History  Problem Relation Age of Onset  . GER disease Brother   . Cancer Neg Hx    Social History   Social History Narrative   Single. 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   Never smoker, occ etoh, no drugs.        Allergies as of 06/17/2019      Reactions   Zithromax [azithromycin] Hives      Medication List       Accurate as of June 17, 2019  9:18 AM. If you have any questions, ask your nurse or doctor.        cholecalciferol 1000 units tablet Commonly known as: VITAMIN D Take 2,000 Units by mouth daily.   Diclofenac Sodium 2 % Soln Commonly known as: Pennsaid Place 1 application onto the skin 2 (two) times daily.   fluticasone 50 MCG/ACT nasal spray Commonly known as: FLONASE Place 2 sprays into both nostrils daily.   levocetirizine 5 MG tablet Commonly known as: Xyzal Take 1 tablet (5 mg total) by mouth every evening.       All past medical history, surgical history, allergies, family history, immunizations andmedications were updated in the EMR today and reviewed under the history and medication portions of their EMR.     No results found for this or any previous visit (from the past  2160 hour(s)).  Mm 3d Screen Breast Bilateral  Result Date: 09/20/2018 CLINICAL DATA:  Screening. EXAM: DIGITAL SCREENING BILATERAL MAMMOGRAM WITH TOMO AND CAD COMPARISON:  Previous exam(s). ACR Breast Density Category c: The breast tissue is heterogeneously dense, which may obscure small masses. FINDINGS: There are no findings suspicious for malignancy. Images were processed with CAD. IMPRESSION: No mammographic evidence of malignancy. A result letter of this screening mammogram will be mailed directly to the patient.  RECOMMENDATION: Screening mammogram in one year. (Code:SM-B-01Y) BI-RADS CATEGORY  1: Negative. Electronically Signed   By: Ammie Ferrier M.D.   On: 09/20/2018 13:09     ROS: 14 pt review of systems performed and negative (unless mentioned in an HPI)  Objective: BP 114/81 (BP Location: Right Arm, Patient Position: Sitting, Cuff Size: Large)   Pulse 62   Temp 98.3 F (36.8 C) (Temporal)   Resp 15   Ht 5' 2.6" (1.59 m)   Wt 192 lb (87.1 kg)   LMP 06/16/2019 (Exact Date)   SpO2 98%   BMI 34.45 kg/m  Gen: Afebrile. No acute distress. Nontoxic in appearance, well-developed, well-nourished,  Pleasant AAF.  HENT: AT. [place]. Bilateral TM visualized and normal in appearance, normal external auditory canal. MMM, no oral lesions, adequate dentition. Bilateral nares within normal limits. Throat without erythema, ulcerations or exudates. no Cough on exam, no hoarseness on exam. Eyes:Pupils Equal Round Reactive to light, Extraocular movements intact,  Conjunctiva without redness, discharge or icterus. Neck/lymp/endocrine: Supple,no lymphadenopathy, no thyromegaly CV: RRR no murmur, no edema, +2/4 P posterior tibialis pulses. no carotid bruits. No JVD. Chest: CTAB, no wheeze, rhonchi or crackles. normal Respiratory effort. good Air movement. Abd: Soft. flat. NTND. BS present. no Masses palpated. No hepatosplenomegaly. No rebound tenderness or guarding. Skin: no rashes, purpura or petechiae. Warm and well-perfused. Skin intact. Neuro/Msk:  Normal gait. PERLA. EOMi. Alert. Oriented x3.  Cranial nerves II through XII intact. Muscle strength 5/5 upper/lower extremity. DTRs equal bilaterally. Psych: Normal affect, dress and demeanor. Normal speech. Normal thought content and judgment.   No exam data present  Assessment/plan: Sheila Nunez is a 43 y.o. female present for CPE Obesity (BMI 30-39.9) - education provided, diet and exercise  - Lipid panel Diabetes mellitus screening - Hemoglobin A1c  Encounter for long-term current use of medication - Comp Met (CMET) Screening for deficiency anemia - CBC w/Diff Breast cancer screening - MM 3D SCREEN BREAST BILATERAL; Future Encouraged SBE Vit d def: - not taking vit d routinely taking "sea moss" Irregular menses - TSH Encounter for preventive health examination Patient was encouraged to exercise greater than 150 minutes a week. Patient was encouraged to choose a diet filled with fresh fruits and vegetables, and lean meats. AVS provided to patient today for education/recommendation on gender specific health and safety maintenance. Colonoscopy:No Fhx screen at 89 Mammogram: No fhx. Completed 09/20/2018. BiRads 1. Breast center.  Cervical cancer screening:08/09/2016. last papcompleted here,results: normal/neg co test. 3-5 year rpt. Immunizations: tdap 06/2014, Influenza--> allergic Infectious disease screening: HIV completed2014 DEXA:N/A   Return in about 1 year (around 06/16/2020) for CPE (30 min).  Electronically signed by: Howard Pouch, DO [place]

## 2019-10-06 ENCOUNTER — Ambulatory Visit
Admission: RE | Admit: 2019-10-06 | Discharge: 2019-10-06 | Disposition: A | Discharge status: Home / Self Care | Payer: BC Managed Care – PPO | Source: Ambulatory Visit | Attending: Family Medicine | Admitting: Family Medicine

## 2019-10-06 ENCOUNTER — Other Ambulatory Visit: Payer: Self-pay

## 2019-10-06 DIAGNOSIS — Z1239 Encounter for other screening for malignant neoplasm of breast: Secondary | ICD-10-CM

## 2019-10-06 DIAGNOSIS — Z1231 Encounter for screening mammogram for malignant neoplasm of breast: Secondary | ICD-10-CM | POA: Diagnosis not present

## 2020-01-01 DIAGNOSIS — H1045 Other chronic allergic conjunctivitis: Secondary | ICD-10-CM | POA: Diagnosis not present

## 2020-01-01 DIAGNOSIS — J3089 Other allergic rhinitis: Secondary | ICD-10-CM | POA: Diagnosis not present

## 2020-01-01 DIAGNOSIS — J3081 Allergic rhinitis due to animal (cat) (dog) hair and dander: Secondary | ICD-10-CM | POA: Diagnosis not present

## 2020-01-01 DIAGNOSIS — L2089 Other atopic dermatitis: Secondary | ICD-10-CM | POA: Diagnosis not present

## 2020-01-06 DIAGNOSIS — L239 Allergic contact dermatitis, unspecified cause: Secondary | ICD-10-CM | POA: Diagnosis not present

## 2020-01-06 DIAGNOSIS — L821 Other seborrheic keratosis: Secondary | ICD-10-CM | POA: Diagnosis not present

## 2020-05-07 ENCOUNTER — Encounter: Payer: Self-pay | Admitting: Family Medicine

## 2020-06-17 ENCOUNTER — Encounter: Payer: BC Managed Care – PPO | Admitting: Family Medicine

## 2020-06-30 ENCOUNTER — Encounter: Payer: BC Managed Care – PPO | Admitting: Family Medicine

## 2020-07-07 ENCOUNTER — Encounter: Payer: Self-pay | Admitting: Family Medicine

## 2020-07-07 ENCOUNTER — Ambulatory Visit (INDEPENDENT_AMBULATORY_CARE_PROVIDER_SITE_OTHER): Payer: BC Managed Care – PPO | Admitting: Family Medicine

## 2020-07-07 ENCOUNTER — Other Ambulatory Visit: Payer: Self-pay

## 2020-07-07 VITALS — BP 119/84 | HR 62 | Temp 97.8°F | Resp 16 | Ht 62.0 in | Wt 195.6 lb

## 2020-07-07 DIAGNOSIS — Z1231 Encounter for screening mammogram for malignant neoplasm of breast: Secondary | ICD-10-CM

## 2020-07-07 DIAGNOSIS — Z1159 Encounter for screening for other viral diseases: Secondary | ICD-10-CM

## 2020-07-07 DIAGNOSIS — Z130 Encounter for screening for diseases of the blood and blood-forming organs and certain disorders involving the immune mechanism: Secondary | ICD-10-CM

## 2020-07-07 DIAGNOSIS — E559 Vitamin D deficiency, unspecified: Secondary | ICD-10-CM | POA: Diagnosis not present

## 2020-07-07 DIAGNOSIS — Z131 Encounter for screening for diabetes mellitus: Secondary | ICD-10-CM | POA: Diagnosis not present

## 2020-07-07 DIAGNOSIS — E669 Obesity, unspecified: Secondary | ICD-10-CM | POA: Diagnosis not present

## 2020-07-07 DIAGNOSIS — Z0000 Encounter for general adult medical examination without abnormal findings: Secondary | ICD-10-CM | POA: Diagnosis not present

## 2020-07-07 LAB — COMPREHENSIVE METABOLIC PANEL
ALT: 8 U/L (ref 0–35)
AST: 11 U/L (ref 0–37)
Albumin: 4.3 g/dL (ref 3.5–5.2)
Alkaline Phosphatase: 68 U/L (ref 39–117)
BUN: 7 mg/dL (ref 6–23)
CO2: 28 mEq/L (ref 19–32)
Calcium: 9.3 mg/dL (ref 8.4–10.5)
Chloride: 102 mEq/L (ref 96–112)
Creatinine, Ser: 0.74 mg/dL (ref 0.40–1.20)
GFR: 103.31 mL/min (ref >60.00)
Glucose, Bld: 90 mg/dL (ref 70–99)
Potassium: 3.9 mEq/L (ref 3.5–5.1)
Sodium: 137 mEq/L (ref 135–145)
Total Bilirubin: 0.8 mg/dL (ref 0.2–1.2)
Total Protein: 7.4 g/dL (ref 6.0–8.3)

## 2020-07-07 LAB — CBC WITH DIFFERENTIAL/PLATELET
Basophils Absolute: 0 10*3/uL (ref 0.0–0.1)
Basophils Relative: 0.7 % (ref 0.0–3.0)
Eosinophils Absolute: 0 10*3/uL (ref 0.0–0.7)
Eosinophils Relative: 0.6 % (ref 0.0–5.0)
HCT: 41.7 % (ref 36.0–46.0)
Hemoglobin: 14.1 g/dL (ref 12.0–15.0)
Lymphocytes Relative: 30.6 % (ref 12.0–46.0)
Lymphs Abs: 1.9 10*3/uL (ref 0.7–4.0)
MCHC: 33.7 g/dL (ref 30.0–36.0)
MCV: 87 fl (ref 78.0–100.0)
Monocytes Absolute: 0.3 10*3/uL (ref 0.1–1.0)
Monocytes Relative: 5.6 % (ref 3.0–12.0)
Neutro Abs: 3.8 10*3/uL (ref 1.4–7.7)
Neutrophils Relative %: 62.5 % (ref 43.0–77.0)
Platelets: 175 10*3/uL (ref 150.0–400.0)
RBC: 4.8 Mil/uL (ref 3.87–5.11)
RDW: 12.8 % (ref 11.5–15.5)
WBC: 6.1 10*3/uL (ref 4.0–10.5)

## 2020-07-07 LAB — HEMOGLOBIN A1C: Hgb A1c MFr Bld: 5.4 % (ref 4.6–6.5)

## 2020-07-07 LAB — LIPID PANEL
Cholesterol: 189 mg/dL (ref 0–200)
HDL: 46.1 mg/dL (ref >39.00)
LDL Cholesterol: 130 mg/dL — ABNORMAL HIGH (ref 0–99)
NonHDL: 143.15
Total CHOL/HDL Ratio: 4
Triglycerides: 68 mg/dL (ref 0.0–149.0)
VLDL: 13.6 mg/dL (ref 0.0–40.0)

## 2020-07-07 LAB — VITAMIN D 25 HYDROXY (VIT D DEFICIENCY, FRACTURES): VITD: 33.92 ng/mL (ref 30.00–100.00)

## 2020-07-07 NOTE — Patient Instructions (Addendum)
Health Maintenance, Female Adopting a healthy lifestyle and getting preventive care are important in promoting health and wellness. Ask your health care provider about:  The right schedule for you to have regular tests and exams.  Things you can do on your own to prevent diseases and keep yourself healthy. What should I know about diet, weight, and exercise? Eat a healthy diet   Eat a diet that includes plenty of vegetables, fruits, low-fat dairy products, and lean protein.  Do not eat a lot of foods that are high in solid fats, added sugars, or sodium. Maintain a healthy weight Body mass index (BMI) is used to identify weight problems. It estimates body fat based on height and weight. Your health care provider can help determine your BMI and help you achieve or maintain a healthy weight. Get regular exercise Get regular exercise. This is one of the most important things you can do for your health. Most adults should:  Exercise for at least 150 minutes each week. The exercise should increase your heart rate and make you sweat (moderate-intensity exercise).  Do strengthening exercises at least twice a week. This is in addition to the moderate-intensity exercise.  Spend less time sitting. Even light physical activity can be beneficial. Watch cholesterol and blood lipids Have your blood tested for lipids and cholesterol at 44 years of age, then have this test every 5 years. Have your cholesterol levels checked more often if:  Your lipid or cholesterol levels are high.  You are older than 44 years of age.  You are at high risk for heart disease. What should I know about cancer screening? Depending on your health history and family history, you may need to have cancer screening at various ages. This may include screening for:  Breast cancer.  Cervical cancer.  Colorectal cancer.  Skin cancer.  Lung cancer. What should I know about heart disease, diabetes, and high blood  pressure? Blood pressure and heart disease  High blood pressure causes heart disease and increases the risk of stroke. This is more likely to develop in people who have high blood pressure readings, are of African descent, or are overweight.  Have your blood pressure checked: ? Every 3-5 years if you are 18-39 years of age. ? Every year if you are 40 years old or older. Diabetes Have regular diabetes screenings. This checks your fasting blood sugar level. Have the screening done:  Once every three years after age 40 if you are at a normal weight and have a low risk for diabetes.  More often and at a younger age if you are overweight or have a high risk for diabetes. What should I know about preventing infection? Hepatitis B If you have a higher risk for hepatitis B, you should be screened for this virus. Talk with your health care provider to find out if you are at risk for hepatitis B infection. Hepatitis C Testing is recommended for:  Everyone born from 1945 through 1965.  Anyone with known risk factors for hepatitis C. Sexually transmitted infections (STIs)  Get screened for STIs, including gonorrhea and chlamydia, if: ? You are sexually active and are younger than 44 years of age. ? You are older than 44 years of age and your health care provider tells you that you are at risk for this type of infection. ? Your sexual activity has changed since you were last screened, and you are at increased risk for chlamydia or gonorrhea. Ask your health care provider if   you are at risk.  Ask your health care provider about whether you are at high risk for HIV. Your health care provider may recommend a prescription medicine to help prevent HIV infection. If you choose to take medicine to prevent HIV, you should first get tested for HIV. You should then be tested every 3 months for as long as you are taking the medicine. Pregnancy  If you are about to stop having your period (premenopausal) and  you may become pregnant, seek counseling before you get pregnant.  Take 400 to 800 micrograms (mcg) of folic acid every day if you become pregnant.  Ask for birth control (contraception) if you want to prevent pregnancy. Osteoporosis and menopause Osteoporosis is a disease in which the bones lose minerals and strength with aging. This can result in bone fractures. If you are 6 years old or older, or if you are at risk for osteoporosis and fractures, ask your health care provider if you should:  Be screened for bone loss.  Take a calcium or vitamin D supplement to lower your risk of fractures.  Be given hormone replacement therapy (HRT) to treat symptoms of menopause. Follow these instructions at home: Lifestyle  Do not use any products that contain nicotine or tobacco, such as cigarettes, e-cigarettes, and chewing tobacco. If you need help quitting, ask your health care provider.  Do not use street drugs.  Do not share needles.  Ask your health care provider for help if you need support or information about quitting drugs. Alcohol use  Do not drink alcohol if: ? Your health care provider tells you not to drink. ? You are pregnant, may be pregnant, or are planning to become pregnant.  If you drink alcohol: ? Limit how much you use to 0-1 drink a day. ? Limit intake if you are breastfeeding.  Be aware of how much alcohol is in your drink. In the [place] one drink equals one 12 oz bottle of beer (355 mL), one 5 oz glass of wine (148 mL), or one 1 oz glass of hard liquor (44 mL). General instructions  Schedule regular health, dental, and eye exams.  Stay current with your vaccines.  Tell your health care provider if: ? You often feel depressed. ? You have ever been abused or do not feel safe at home. Summary  Adopting a healthy lifestyle and getting preventive care are important in promoting health and wellness.  Follow your health care provider's instructions about healthy  diet, exercising, and getting tested or screened for diseases.  Follow your health care provider's instructions on monitoring your cholesterol and blood pressure. This information is not intended to replace advice given to you by your health care provider. Make sure you discuss any questions you have with your health care provider. Document Revised: 10/30/2018 Document Reviewed: 10/30/2018 Elsevier Patient Education  [place].   Body mass index is 35.78 kg/m.  A good goal weight for you by your records is about 170.    BMI for Adults What is BMI? Body mass index (BMI) is a number that is calculated from a person's weight and height. BMI can help estimate how much of a person's weight is composed of fat. BMI does not measure body fat directly. Rather, it is an alternative to procedures that directly measure body fat, which can be difficult and expensive. BMI can help identify people who may be at higher risk for certain medical problems. What are BMI measurements used for? BMI is used  as a screening tool to identify possible weight problems. It helps determine whether a person is obese, overweight, a healthy weight, or underweight. BMI is useful for:  Identifying a weight problem that may be related to a medical condition or may increase the risk for medical problems.  Promoting changes, such as changes in diet and exercise, to help reach a healthy weight. BMI screening can be repeated to see if these changes are working. How is BMI calculated? BMI involves measuring your weight in relation to your height. Both height and weight are measured, and the BMI is calculated from those numbers. This can be done either in [place] ([place] or metric measurements. Note that charts and online BMI calculators are available to help you find your BMI quickly and easily without having to do these calculations yourself. To calculate your BMI in English ([place] measurements:  1. Measure your weight  in pounds (lb). 2. Multiply the number of pounds by 703. ? For example, for a person who weighs 180 lb, multiply that number by 703, which equals 126,540. 3. Measure your height in inches. Then multiply that number by itself to get a measurement called "inches squared." ? For example, for a person who is 70 inches tall, the "inches squared" measurement is 70 inches x 70 inches, which equals 4,900 inches squared. 4. Divide the total from step 2 (number of lb x 703) by the total from step 3 (inches squared): 126,540  4,900 = 25.8. This is your BMI. To calculate your BMI in metric measurements: 1. Measure your weight in kilograms (kg). 2. Measure your height in meters (m). Then multiply that number by itself to get a measurement called "meters squared." ? For example, for a person who is 1.75 m tall, the "meters squared" measurement is 1.75 m x 1.75 m, which is equal to 3.1 meters squared. 3. Divide the number of kilograms (your weight) by the meters squared number. In this example: 70  3.1 = 22.6. This is your BMI. What do the results mean? BMI charts are used to identify whether you are underweight, normal weight, overweight, or obese. The following guidelines will be used:  Underweight: BMI less than 18.5.  Normal weight: BMI between 18.5 and 24.9.  Overweight: BMI between 25 and 29.9.  Obese: BMI of 30 or above. Keep these notes in mind:  Weight includes both fat and muscle, so someone with a muscular build, such as an athlete, may have a BMI that is higher than 24.9. In cases like these, BMI is not an accurate measure of body fat.  To determine if excess body fat is the cause of a BMI of 25 or higher, further assessments may need to be done by a health care provider.  BMI is usually interpreted in the same way for men and women. Where to find more information For more information about BMI, including tools to quickly calculate your BMI, go to these websites:  Centers for Disease  Control and Prevention: http://www.wolf.info/  American Heart Association: www.heart.org  National Heart, Lung, and Blood Institute: https://wilson-eaton.com/ Summary  Body mass index (BMI) is a number that is calculated from a person's weight and height.  BMI may help estimate how much of a person's weight is composed of fat. BMI can help identify those who may be at higher risk for certain medical problems.  BMI can be measured using English measurements or metric measurements.  BMI charts are used to identify whether you are underweight, normal weight, overweight,  or obese. This information is not intended to replace advice given to you by your health care provider. Make sure you discuss any questions you have with your health care provider. Document Revised: 07/30/2019 Document Reviewed: 06/06/2019 Elsevier Patient Education  [place].

## 2020-07-07 NOTE — Progress Notes (Signed)
This visit occurred during the SARS-CoV-2 public health emergency.  Safety protocols were in place, including screening questions prior to the visit, additional usage of staff PPE, and extensive cleaning of exam room while observing appropriate contact time as indicated for disinfecting solutions.    Patient ID: Sheila Nunez, female  DOB: January 14, 1976, 44 y.o.   MRN: [id number] Patient Care Team    Relationship Specialty Notifications Start End  Ma Hillock, DO PCP - General Family Medicine  07/19/15   Tiajuana Amass, MD Referring Physician Allergy and Immunology  05/25/16   [name], DO Consulting Physician Sports Medicine  05/06/18     Chief Complaint  Patient presents with  . Annual Exam    pt is fasting    Subjective:  Sheila Nunez is a 44 y.o.  Female  present for CPE. All past medical history, surgical history, allergies, family history, immunizations, medications and social history were updated in the electronic medical record today. All recent labs, ED visits and hospitalizations within the last year were reviewed.  Well women: Menstrual cyclesapproximatelyevery 25days, her mensesstillare not heavy, last about5 daysdays (longer than prior).Denies vaginal discharge, irritation or compliants. She does have cramping with cycles now, which is new. She has bloating pre-cycle. Shestill does not routinelyperforms SBE exams. Patient's last menstrual period was 06/15/2020.   Health maintenance: Colonoscopy:No Fhx screen at 43 Mammogram: No fhx.Completed 09/2019. [facility]. Breast center. Cervical cancer screening:08/09/2016. last papcompleted here,results: normal/neg co test. 5 year rpt. Immunizations: tdap 06/2014, Influenza--> allergic, covid counseled Infectious disease screening: HIV completed2014 DEXA:N/A Assistive device: none Oxygen SXJ:DBZM Patient has a Dental home. Hospitalizations/ED visits: reviewed    Depression screen [facility] 2/9 06/17/2019  06/10/2018 04/29/2018 06/07/2017 05/25/2016  Decreased Interest 0 0 0 0 0  Down, Depressed, Hopeless 0 0 0 0 0  PHQ - 2 Score 0 0 0 0 0   No flowsheet data found.    Immunization History  Administered Date(s) Administered  . Influenza-Unspecified 09/20/2018  . Tdap 07/14/2014    Past Medical History:  Diagnosis Date  . Allergy    Dr. Allyson Sabal  . Depression   . Eczema    Dr. Allyson Sabal   Allergies  Allergen Reactions  . Zithromax [Azithromycin] Hives   Past Surgical History:  Procedure Laterality Date  . CYST EXCISION     eyelids, Dr. Gershon Crane   Family History  Problem Relation Age of Onset  . GER disease Brother   . Cancer Neg Hx    Social History   Social History Narrative   Single. 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   Never smoker, occ etoh, no drugs.        Allergies as of 07/07/2020      Reactions   Zithromax [azithromycin] Hives      Medication List       Accurate as of July 07, 2020  9:41 AM. If you have any questions, ask your nurse or doctor.        STOP taking these medications   levocetirizine 5 MG tablet Commonly known as: Xyzal Stopped by: Howard Pouch, DO     TAKE these medications   cholecalciferol 1000 units tablet Commonly known as: VITAMIN D Take 2,000 Units by mouth daily.       All past medical history, surgical history, allergies, family history, immunizations andmedications were updated in the EMR today and  reviewed under the history and medication portions of their EMR.     No results found for this or any previous visit (from the past 2160 hour(s)).  MM 3D SCREEN BREAST BILATERAL  Result Date: 10/06/2019 CLINICAL DATA:  Screening. EXAM: DIGITAL SCREENING BILATERAL MAMMOGRAM WITH TOMO AND CAD COMPARISON:  Previous exam(s). ACR Breast Density Category c: The breast tissue is heterogeneously dense, which may obscure small masses. FINDINGS: There  are no findings suspicious for malignancy. Images were processed with CAD. IMPRESSION: No mammographic evidence of malignancy. A result letter of this screening mammogram will be mailed directly to the patient. RECOMMENDATION: Screening mammogram in one year. (Code:SM-B-01Y) BI-RADS CATEGORY  1: Negative. Electronically Signed   By: Kristopher Oppenheim M.D.   On: 10/06/2019 15:18     ROS: 14 pt review of systems performed and negative (unless mentioned in an HPI)  Objective: BP 119/84 (BP Location: Left Arm, Patient Position: Sitting, Cuff Size: Normal)   Pulse 62   Temp 97.8 F (36.6 C) (Temporal)   Resp 16   Ht 5' 2"  (1.575 m)   Wt 195 lb 9.6 oz (88.7 kg)   LMP 06/15/2020   SpO2 95%   BMI 35.78 kg/m  Gen: Afebrile. No acute distress. Nontoxic in appearance, well-developed, well-nourished,  Overweight, pleasant female.  HENT: AT. [place]. Bilateral TM visualized and normal in appearance, normal external auditory canal. MMM, no oral lesions, adequate dentition. Bilateral nares within normal limits. Throat without erythema, ulcerations or exudates. no Cough on exam, no hoarseness on exam. Eyes:Pupils Equal Round Reactive to light, Extraocular movements intact,  Conjunctiva without redness, discharge or icterus. Neck/lymp/endocrine: Supple,no lymphadenopathy, no thyromegaly CV: RRR no murmur, no edema, +2/4 P posterior tibialis pulses.Marland Kitchen No JVD. Chest: CTAB, no wheeze, rhonchi or crackles. normal Respiratory effort. good Air movement. Abd: Soft. flat. NTND. BS present. no Masses palpated. No hepatosplenomegaly. No rebound tenderness or guarding. Skin: no rashes, purpura or petechiae. Warm and well-perfused. Skin intact. Neuro/Msk:  Normal gait. PERLA. EOMi. Alert. Oriented x3.  Cranial nerves II through XII intact. Muscle strength 5/5 upper/lower extremity. DTRs equal bilaterally. Psych: Normal affect, dress and demeanor. Normal speech. Normal thought content and judgment.   No exam data  present  Assessment/plan: Sheila Nunez is a 44 y.o. female present for  Vitamin D deficiency Supplementing daily.  - VITAMIN D 25 Hydroxy (Vit-D Deficiency, Fractures) Obesity (BMI 30-39.9) Discussed diet and exercise.  - Lipid panel Screening for deficiency anemia - CBC with Differential/Platelet Diabetes mellitus screening - Comprehensive metabolic panel - Hemoglobin A1c Need for hepatitis C screening test - Hepatitis C Antibody Encounter for screening mammogram for malignant neoplasm of breast - MM 3D SCREEN BREAST BILATERAL; Future Encounter for preventive health examination Patient was encouraged to exercise greater than 150 minutes a week. Patient was encouraged to choose a diet filled with fresh fruits and vegetables, and lean meats. AVS provided to patient today for education/recommendation on gender specific health and safety maintenance. Colonoscopy:No Fhx screen at 72 Mammogram: No fhx.Completed 09/2019. BiRads 1. Breast center. Cervical cancer screening:08/09/2016. last papcompleted here,results: normal/neg co test. 5 year rpt.DUE 2022 Immunizations: tdap 06/2014, Influenza--> allergic, covid counseled Infectious disease screening: HIV completed2014 DEXA:N/A  Return in about 1 year (around 07/07/2021) for CPE (30 min).    Orders Placed This Encounter  Procedures  . MM 3D SCREEN BREAST BILATERAL  . Comprehensive metabolic panel  . CBC with Differential/Platelet  . Lipid panel  . Hemoglobin A1c  . VITAMIN D 25 Hydroxy (  Vit-D Deficiency, Fractures)  . Hepatitis C Antibody   No orders of the defined types were placed in this encounter.  Referral Orders  No referral(s) requested today     Electronically signed by: Howard Pouch, [place]

## 2020-07-08 LAB — HEPATITIS C ANTIBODY
Hepatitis C Ab: NONREACTIVE
SIGNAL TO CUT-OFF: 0.02 (ref <1.00)

## 2020-07-08 NOTE — Progress Notes (Signed)
My Chart message sent

## 2020-07-13 DIAGNOSIS — M7711 Lateral epicondylitis, right elbow: Secondary | ICD-10-CM | POA: Diagnosis not present

## 2020-07-13 DIAGNOSIS — M25521 Pain in right elbow: Secondary | ICD-10-CM | POA: Diagnosis not present

## 2020-09-28 ENCOUNTER — Other Ambulatory Visit: Payer: Self-pay | Admitting: Family Medicine

## 2020-09-28 DIAGNOSIS — Z1231 Encounter for screening mammogram for malignant neoplasm of breast: Secondary | ICD-10-CM

## 2020-10-06 ENCOUNTER — Other Ambulatory Visit: Payer: Self-pay

## 2020-10-06 ENCOUNTER — Ambulatory Visit
Admission: RE | Admit: 2020-10-06 | Discharge: 2020-10-06 | Disposition: A | Discharge status: Home / Self Care | Payer: BC Managed Care – PPO | Source: Ambulatory Visit

## 2020-10-06 DIAGNOSIS — Z1231 Encounter for screening mammogram for malignant neoplasm of breast: Secondary | ICD-10-CM

## 2021-01-06 DIAGNOSIS — L209 Atopic dermatitis, unspecified: Secondary | ICD-10-CM | POA: Diagnosis not present

## 2021-01-06 DIAGNOSIS — J3081 Allergic rhinitis due to animal (cat) (dog) hair and dander: Secondary | ICD-10-CM | POA: Diagnosis not present

## 2021-01-06 DIAGNOSIS — H1045 Other chronic allergic conjunctivitis: Secondary | ICD-10-CM | POA: Diagnosis not present

## 2021-01-06 DIAGNOSIS — J3089 Other allergic rhinitis: Secondary | ICD-10-CM | POA: Diagnosis not present

## 2021-04-01 ENCOUNTER — Encounter: Payer: Self-pay | Admitting: Family Medicine

## 2021-04-01 ENCOUNTER — Telehealth (INDEPENDENT_AMBULATORY_CARE_PROVIDER_SITE_OTHER): Payer: BC Managed Care – PPO | Admitting: Family Medicine

## 2021-04-01 ENCOUNTER — Other Ambulatory Visit: Payer: Self-pay

## 2021-04-01 ENCOUNTER — Telehealth: Payer: Self-pay

## 2021-04-01 VITALS — Temp 100.4°F

## 2021-04-01 DIAGNOSIS — R509 Fever, unspecified: Secondary | ICD-10-CM | POA: Diagnosis not present

## 2021-04-01 DIAGNOSIS — U071 COVID-19: Secondary | ICD-10-CM

## 2021-04-01 DIAGNOSIS — H1045 Other chronic allergic conjunctivitis: Secondary | ICD-10-CM | POA: Insufficient documentation

## 2021-04-01 DIAGNOSIS — L209 Atopic dermatitis, unspecified: Secondary | ICD-10-CM

## 2021-04-01 DIAGNOSIS — J3081 Allergic rhinitis due to animal (cat) (dog) hair and dander: Secondary | ICD-10-CM | POA: Insufficient documentation

## 2021-04-01 DIAGNOSIS — Z20822 Contact with and (suspected) exposure to covid-19: Secondary | ICD-10-CM | POA: Diagnosis not present

## 2021-04-01 HISTORY — DX: Other chronic allergic conjunctivitis: H10.45

## 2021-04-01 HISTORY — DX: Atopic dermatitis, unspecified: L20.9

## 2021-04-01 MED ORDER — NIRMATRELVIR/RITONAVIR (PAXLOVID)TABLET: 3.0000 | ORAL_TABLET | Freq: Two times a day (BID) | ORAL | 0 refills | Status: AC

## 2021-04-01 NOTE — Patient Instructions (Signed)
10 Things You Can Do to Manage Your COVID-19 Symptoms at Home If you have possible or confirmed COVID-19: 1. Stay home except to get medical care. 2. Monitor your symptoms carefully. If your symptoms get worse, call your healthcare provider immediately. 3. Get rest and stay hydrated. 4. If you have a medical appointment, call the healthcare provider ahead of time and tell them that you have or may have COVID-19. 5. For medical emergencies, call 911 and notify the dispatch personnel that you have or may have COVID-19. 6. Cover your cough and sneezes with a tissue or use the inside of your elbow. 7. Wash your hands often with soap and water for at least 20 seconds or clean your hands with an alcohol-based hand sanitizer that contains at least 60% alcohol. 8. As much as possible, stay in a specific room and away from other people in your home. Also, you should use a separate bathroom, if available. If you need to be around other people in or outside of the home, wear a mask. 9. Avoid sharing personal items with other people in your household, like dishes, towels, and bedding. 10. Clean all surfaces that are touched often, like counters, tabletops, and doorknobs. Use household cleaning sprays or wipes according to the label instructions. [email or web address] 06/04/2020 This information is not intended to replace advice given to you by your health care provider. Make sure you discuss any questions you have with your health care provider. Document Revised: 09/20/2020 Document Reviewed: 09/20/2020 Elsevier Patient Education  2021 [employer].

## 2021-04-01 NOTE — Progress Notes (Signed)
VIRTUAL VISIT VIA VIDEO  I connected with Sheila Nunez on 04/01/21 at  9:30 AM EDT by elemedicine application and verified that I am speaking with the correct person using two identifiers. Location patient: Home Location provider: [facility] Office Persons participating in the virtual visit: Patient, Dr. Raoul Pitch and Darnell Level. Cesar, CMA  I discussed the limitations of evaluation and management by telemedicine and the availability of in person appointments. The patient expressed understanding and agreed to proceed.   SUBJECTIVE Chief Complaint  Patient presents with  . Fever    Pt c/o chills, HA, productive cough, fever 100.4 x 2 days; pt c/o COVID exposure from work; pt at home test was pos 03/31/21    HPI: Sheila Nunez is a 45 y.o. female present for acute illness and covid positive home test.  Symptoms: fever, HA, cough, chills, fatigue and decrease appetite.  Denies: shortness of breath Exposure: at work 5/9-08/2021, first symptom 03/30/2021. Positive home test evening of 5/12. She has not had the covid vaccines.  ROS: See pertinent positives and negatives per HPI.  Patient Active Problem List   Diagnosis Date Noted  . Atopic dermatitis 04/01/2021  . Chronic allergic conjunctivitis 04/01/2021  . Allergic rhinitis due to animal (cat) (dog) hair and dander 04/01/2021  . 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 Ganglion cyst of dorsum of right wrist 05/02/2018  . Obesity (BMI 30-39.9) 06/07/2017  . Vitamin D deficiency 05/25/2016  . 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 Allergic rhinitis 02/03/2014    Social History   Tobacco Use  . Smoking status: Never Smoker  . Smokeless tobacco: Never Used  Substance Use Topics  . Alcohol use: Yes    Alcohol/week: 2.0 standard drinks    Types: 2 Glasses of wine per week    Current Outpatient Medications:  .  Azelastine HCl 137 MCG/SPRAY SOLN, Place 1 spray into both nostrils 2 (two) times daily as  needed., Disp: , Rfl:  .  cetirizine (ZYRTEC) 10 MG tablet, Take 10 mg by mouth daily., Disp: , Rfl:  .  cholecalciferol (VITAMIN D) 1000 units tablet, Take 2,000 Units by mouth daily., Disp: , Rfl:  .  fluocinonide ointment (LIDEX) 0.05 %, SMARTSIG:Sparingly Topical Twice Daily PRN, Disp: , Rfl:  .  fluticasone (FLONASE) 50 MCG/ACT nasal spray, 1 spray in each nostril, Disp: , Rfl:  .  ketotifen (ZADITOR) 0.025 % ophthalmic solution, 1gtt into each eye, Disp: , Rfl:  .  nirmatrelvir/ritonavir EUA (PAXLOVID) TABS, Take 3 tablets by mouth 2 (two) times daily for 5 days. Patient GFR is greater than 90-Take nirmatrelvir (150 mg) 2 tablet(s) twice daily for 5 days and ritonavir (100 mg) one tablet twice daily for 5 days., Disp: 30 tablet, Rfl: 0  Allergies  Allergen Reactions  . Zithromax [Azithromycin] Hives    OBJECTIVE: Temp (!) 100.4 F (38 C)  Gen: No acute distress. Nontoxic in appearance.  HENT: AT. [place].  MMM.  Eyes:Pupils Equal Round Reactive to light, Extraocular movements intact,  Conjunctiva without redness, discharge or icterus. Chest: Cough not present on exam.  Skin: no rashes, purpura or petechiae.  Neuro: Normal gait. Alert. Oriented x3  Psych: Normal affect and demeanor. Normal speech. Normal thought content and judgment.  ASSESSMENT AND PLAN: Sheila Nunez is a 45 y.o. female present for  COVID-19/Fever, unspecified fever cause Rest, hydrate. + test at home and has PCR scheduled at [facility] in 1 hour.  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   mucinex (DM if cough), nettie pot or nasal saline.  paxlovid  prescribed, take until completed.  If cough present it can last up to 6-8 weeks.  F/U 2 weeks of not improved.     Howard Pouch, DO 04/01/2021   Return if symptoms worsen or fail to improve.  No orders of the defined types were placed in this encounter.  Meds ordered this encounter  Medications  . nirmatrelvir/ritonavir EUA (PAXLOVID) TABS    Sig:  Take 3 tablets by mouth 2 (two) times daily for 5 days. Patient GFR is greater than 90-Take nirmatrelvir (150 mg) 2 tablet(s) twice daily for 5 days and ritonavir (100 mg) one tablet twice daily for 5 days.    Dispense:  30 tablet    Refill:  0   Referral Orders  No referral(s) requested today

## 2021-04-01 NOTE — Telephone Encounter (Signed)
Pt had VV with PCP today  [facility] - Client TELEPHONE ADVICE RECORD [facility] Patient Name: Sheila Nunez Gender: Female DOB: [birth year] Age: 46 Y 55 M 1 D Return Phone Number: [phone] (Primary) Address: City/ State/ Zip: [place] [address] Client [facility] - Client Client Site [facility] - Day Physician Raoul Pitch, [place] Contact Type Call Who Is Calling Patient / Member / Family / Caregiver Call Type Triage / Clinical Relationship To Patient Self Return Phone Number [phone] (Primary) Chief Complaint Headache Reason for Call Symptomatic / Request for Health Information Initial Comment Caller states at home COVID 19 tests was pos; sore throat, chills; headache; work requires PCR test; Translation No Nurse Assessment Nurse: Hammonds, RN, Epifanio Lesches Date/Time (Eastern Time): 04/01/2021 8:11:52 AM Confirm and document reason for call. If symptomatic, describe symptoms. ---Caller states: the symptoms chills, sore throat, headache started wednesday, no fever. I did the at home COVID 19 tests and it was positive. My work requires PCR test; No difficulty breathing and mild cough. Does the patient have any new or worsening symptoms? ---Yes Will a triage be completed? ---Yes Related visit to physician within the last 2 weeks? ---No Does the PT have any chronic conditions? (i.e. diabetes, asthma, this includes High risk factors for pregnancy, etc.) ---No Is the patient pregnant or possibly pregnant? (Ask all females between the ages of 40-55) ---No Is this a behavioral health or substance abuse call? ---No Guidelines Guideline Title Affirmed Question Affirmed Notes Nurse Date/Time (Eastern Time) COVID-19 - Diagnosed or Suspected [1] COVID-19 diagnosed by positive lab test (e.g., PCR, rapid self-test kit) AND [2] mild symptoms (e.g., cough, fever, others) AND [3] no Hammonds,  RN, Lissa 04/01/2021 8:13:50 AM PLEASE NOTE: All timestamps contained within this report are represented as [place] Standard Time. CONFIDENTIALTY NOTICE: This fax transmission is intended only for the addressee. It contains information that is legally privileged, confidential or otherwise protected from use or disclosure. If you are not the intended recipient, you are strictly prohibited from reviewing, disclosing, copying using or disseminating any of this information or taking any action in reliance on or regarding this information. If you have received this fax in error, please notify us immediately by telephone so that we can arrange for its return to [place]. Phone: [phone], Toll-Free: [phone], Fax: [phone] Page: 2 of 2 Call Id: [id number] Guidelines Guideline Title Affirmed Question Affirmed Notes Nurse Date/Time Eilene Ghazi Time) complications or SOB Disp. Time Eilene Ghazi Time) Disposition Final User 04/01/2021 8:17:11 AM Home Care Yes Hammonds, RN, Lissa Caller Disagree/Comply Comply Caller Understands Yes PreDisposition Did not know what to do Care Advice Given Per Guideline HOME CARE: * You should be able to treat this at home. * You had a recent lab test for COVID-19 and it came back positive. REASSURANCE AND EDUCATION - POSITIVE COVID-19 LAB TEST AND MILD SYMPTOMS: GENERAL CARE ADVICE FOR COVID-19 SYMPTOMS: * Cough: Use cough drops. COUGH MEDICINES: * COUGH SYRUP WITH DEXTROMETHORPHAN: An over-the-counter cough syrup can help your cough. The most common cough suppressant in overthe-counter cough medicines is dextromethorphan. * Fever: For fever over 101 F (38.3 C), take acetaminophen every 4 to 6 hours (Adults 650 mg) OR ibuprofen every 6 to 8 hours (Adults 400 mg). Before taking any medicine, read all the instructions on the package. Do not take aspirin unless your doctor has prescribed it for you. * Muscle aches, headache, and other pains: Often this comes and goes with  the fever. Take acetaminophen every 4 to 6 hours (Adults 650 mg) OR ibuprofen every 6 to 8 hours (Adults 400 mg). Before taking any medicine, read all the instructions on the package. CALL BACK IF: * You become worse CARE ADVICE given per COVID-19 - DIAGNOSED OR SUSPECTED (Adult) guideline. * Chest pain or difficulty breathing occurs After Care Instructions Given Call Event Type User Date / Time Description Education document email Hammonds, RN, Epifanio Lesches 04/01/2021 8:20:33 AM COVID-19 Diagnosed or Suspected Comments User: Moses Manners, RN Date/Time Eilene Ghazi Time): 04/01/2021 8:10:56 AM Contacted office and told they do offer PCR testing but must have appointment to be seen by their Dr. User: Moses Manners, RN Date/Time Eilene Ghazi Time): 04/01/2021 8:13:39 AM I haven't had COVID vaccines. No influenza vac either. User: Moses Manners, RN Date/Time Eilene Ghazi Time): 04/01/2021 8:21:12 AM current temp 100.4 Referrals REFERRED TO PCP OFFICE

## 2021-04-02 ENCOUNTER — Encounter: Payer: Self-pay | Admitting: Family Medicine

## 2021-04-04 NOTE — Telephone Encounter (Signed)
noted 

## 2021-04-06 ENCOUNTER — Encounter: Payer: Self-pay | Admitting: Family Medicine

## 2021-07-08 ENCOUNTER — Encounter: Payer: BC Managed Care – PPO | Admitting: Family Medicine

## 2021-07-15 ENCOUNTER — Encounter: Payer: Self-pay | Admitting: Family Medicine

## 2021-07-15 NOTE — Telephone Encounter (Signed)
Pap is due 08/09/21.   Please advise on tea.

## 2021-07-15 NOTE — Telephone Encounter (Signed)
Tea is ok, as long as no sweetener or cream.  Pap is due

## 2021-07-19 ENCOUNTER — Other Ambulatory Visit: Payer: Self-pay

## 2021-07-20 ENCOUNTER — Ambulatory Visit (INDEPENDENT_AMBULATORY_CARE_PROVIDER_SITE_OTHER): Payer: BC Managed Care – PPO | Admitting: Family Medicine

## 2021-07-20 ENCOUNTER — Other Ambulatory Visit (HOSPITAL_COMMUNITY)
Admission: RE | Admit: 2021-07-20 | Discharge: 2021-07-20 | Disposition: A | Discharge status: Home / Self Care | Payer: BC Managed Care – PPO | Source: Ambulatory Visit | Attending: Family Medicine | Admitting: Family Medicine

## 2021-07-20 ENCOUNTER — Encounter: Payer: Self-pay | Admitting: Family Medicine

## 2021-07-20 VITALS — BP 130/77 | HR 62 | Temp 97.9°F | Ht 62.25 in | Wt 196.0 lb

## 2021-07-20 DIAGNOSIS — N926 Irregular menstruation, unspecified: Secondary | ICD-10-CM | POA: Diagnosis not present

## 2021-07-20 DIAGNOSIS — Z01419 Encounter for gynecological examination (general) (routine) without abnormal findings: Secondary | ICD-10-CM | POA: Diagnosis not present

## 2021-07-20 DIAGNOSIS — E559 Vitamin D deficiency, unspecified: Secondary | ICD-10-CM | POA: Diagnosis not present

## 2021-07-20 DIAGNOSIS — E669 Obesity, unspecified: Secondary | ICD-10-CM | POA: Diagnosis not present

## 2021-07-20 DIAGNOSIS — N852 Hypertrophy of uterus: Secondary | ICD-10-CM

## 2021-07-20 DIAGNOSIS — Z0000 Encounter for general adult medical examination without abnormal findings: Secondary | ICD-10-CM | POA: Diagnosis not present

## 2021-07-20 DIAGNOSIS — Z1231 Encounter for screening mammogram for malignant neoplasm of breast: Secondary | ICD-10-CM

## 2021-07-20 DIAGNOSIS — Z131 Encounter for screening for diabetes mellitus: Secondary | ICD-10-CM

## 2021-07-20 DIAGNOSIS — Z130 Encounter for screening for diseases of the blood and blood-forming organs and certain disorders involving the immune mechanism: Secondary | ICD-10-CM

## 2021-07-20 LAB — LIPID PANEL
Cholesterol: 233 mg/dL — ABNORMAL HIGH (ref 0–200)
HDL: 54.2 mg/dL (ref >39.00)
LDL Cholesterol: 161 mg/dL — ABNORMAL HIGH (ref 0–99)
NonHDL: 178.7
Total CHOL/HDL Ratio: 4
Triglycerides: 91 mg/dL (ref 0.0–149.0)
VLDL: 18.2 mg/dL (ref 0.0–40.0)

## 2021-07-20 LAB — CBC WITH DIFFERENTIAL/PLATELET
Basophils Absolute: 0 10*3/uL (ref 0.0–0.1)
Basophils Relative: 0.6 % (ref 0.0–3.0)
Eosinophils Absolute: 0.1 10*3/uL (ref 0.0–0.7)
Eosinophils Relative: 1.1 % (ref 0.0–5.0)
HCT: 43.7 % (ref 36.0–46.0)
Hemoglobin: 14.5 g/dL (ref 12.0–15.0)
Lymphocytes Relative: 36 % (ref 12.0–46.0)
Lymphs Abs: 2.4 10*3/uL (ref 0.7–4.0)
MCHC: 33.3 g/dL (ref 30.0–36.0)
MCV: 87.4 fl (ref 78.0–100.0)
Monocytes Absolute: 0.4 10*3/uL (ref 0.1–1.0)
Monocytes Relative: 6.1 % (ref 3.0–12.0)
Neutro Abs: 3.8 10*3/uL (ref 1.4–7.7)
Neutrophils Relative %: 56.2 % (ref 43.0–77.0)
Platelets: 216 10*3/uL (ref 150.0–400.0)
RBC: 5 Mil/uL (ref 3.87–5.11)
RDW: 13.1 % (ref 11.5–15.5)
WBC: 6.8 10*3/uL (ref 4.0–10.5)

## 2021-07-20 LAB — COMPREHENSIVE METABOLIC PANEL
ALT: 16 U/L (ref 0–35)
AST: 17 U/L (ref 0–37)
Albumin: 4.4 g/dL (ref 3.5–5.2)
Alkaline Phosphatase: 74 U/L (ref 39–117)
BUN: 8 mg/dL (ref 6–23)
CO2: 31 mEq/L (ref 19–32)
Calcium: 9.7 mg/dL (ref 8.4–10.5)
Chloride: 99 mEq/L (ref 96–112)
Creatinine, Ser: 0.75 mg/dL (ref 0.40–1.20)
GFR: 96.62 mL/min (ref >60.00)
Glucose, Bld: 77 mg/dL (ref 70–99)
Potassium: 3.6 mEq/L (ref 3.5–5.1)
Sodium: 136 mEq/L (ref 135–145)
Total Bilirubin: 0.7 mg/dL (ref 0.2–1.2)
Total Protein: 7.9 g/dL (ref 6.0–8.3)

## 2021-07-20 LAB — TSH: TSH: 1.58 u[IU]/mL (ref 0.35–5.50)

## 2021-07-20 LAB — VITAMIN D 25 HYDROXY (VIT D DEFICIENCY, FRACTURES): VITD: 36.13 ng/mL (ref 30.00–100.00)

## 2021-07-20 LAB — HEMOGLOBIN A1C: Hgb A1c MFr Bld: 5.4 % (ref 4.6–6.5)

## 2021-07-20 NOTE — Progress Notes (Signed)
This visit occurred during the SARS-CoV-2 public health emergency.  Safety protocols were in place, including screening questions prior to the visit, additional usage of staff PPE, and extensive cleaning of exam room while observing appropriate contact time as indicated for disinfecting solutions.    Patient ID: Sheila Nunez, female  DOB: 1975/12/10, 45 y.o.   MRN: [id number] Patient Care Team    Relationship Specialty Notifications Start End  Ma Hillock, DO PCP - General Family Medicine  07/19/15   Tiajuana Amass, MD Referring Physician Allergy and Immunology  05/25/16   [name], DO Consulting Physician Sports Medicine  05/06/18     Chief Complaint  Patient presents with   Annual Exam    Pt is fasting;    Subjective: Sheila Nunez is a 45 y.o.  Female  present for CPE. All past medical history, surgical history, allergies, family history, immunizations, medications and social history were updated in the electronic medical record today. All recent labs, ED visits and hospitalizations within the last year were reviewed.  Well women: Menstrual cycles approximately every 25 days, her menses still are not heavy, last about 5 days days (longer than prior).Denies vaginal discharge, irritation or compliants. She does have cramping with cycles now, which is new. She has bloating pre-cycle and her last cycle was heavier and she had back pain/cramping the entire 5 days of cycle. She still does not routinely  performs SBE exams> but getting better this year. Patient's Patient's last menstrual period was 07/14/2021 (exact date).  Health maintenance: Colonoscopy: No Fhx screen at 45> cologuard after jan 2023.  Mammogram: No fhx. Completed 09/2020. BiRads 1. Breast center. > ordered placed Cervical cancer screening: 08/09/2016. > completed today w/ cotest Immunizations: tdap 06/2014, Influenza --> allergic, covid counseled Infectious disease screening: HIV and hep c  completed  DEXA:  routine screen Assistive device: none Oxygen UMP:NTIR Patient has a Dental home. Hospitalizations/ED visits: reviewed  Depression screen [facility] 2/9 07/20/2021 04/01/2021 06/17/2019 06/10/2018 04/29/2018  Decreased Interest 0 0 0 0 0  Down, Depressed, Hopeless 0 0 0 0 0  PHQ - 2 Score 0 0 0 0 0   No flowsheet data found.  Immunization History  Administered Date(s) Administered   Influenza, High Dose Seasonal PF 01/06/2021   Influenza-Unspecified 09/20/2018   Tdap 07/14/2014     Past Medical History:  Diagnosis Date   Allergy    Dr. Allyson Sabal   Atopic dermatitis 04/01/2021   Chronic allergic conjunctivitis 04/01/2021   Depression    Eczema    Dr. 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 Gershon Crane   Family History  Problem Relation Age of Onset   GER disease Brother    Cancer Neg Hx    Social History   Social History Narrative   Single. 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   Never smoker, occ etoh, no drugs.        Allergies as of 07/20/2021       Reactions   Zithromax [azithromycin] Hives        Medication List        Accurate as of July 20, 2021  4:35  PM. If you have any questions, ask your nurse or doctor.          STOP taking these medications    cetirizine 10 MG tablet Commonly known as: ZYRTEC Stopped by: Howard Pouch, DO       TAKE these medications    Azelastine HCl 137 MCG/SPRAY Soln Place 1 spray into both nostrils 2 (two) times daily as needed.   cholecalciferol 1000 units tablet Commonly known as: VITAMIN D Take 2,000 Units by mouth daily.   EQ FIBER POWDER PO Take by mouth. Herbal Prima   fluocinonide ointment 0.05 % Commonly  known as: LIDEX SMARTSIG:Sparingly Topical Twice Daily PRN   fluticasone 50 MCG/ACT nasal spray Commonly known as: FLONASE 1 spray in each nostril   HAIR/SKIN/NAILS PO Take by mouth.   ketotifen 0.025 % ophthalmic solution Commonly known as: ZADITOR 1gtt into each eye   levocetirizine 5 MG tablet Commonly known as: XYZAL Take 5 mg by mouth every evening.   multivitamin capsule Take 1 capsule by mouth daily.        All past medical history, surgical history, allergies, family history, immunizations andmedications were updated in the EMR today and reviewed under the history and medication portions of their EMR.      MM 3D SCREEN BREAST BILATERAL  Result Date: 10/09/2020 RECOMMENDATION: Screening mammogram in one year. (Code:SM-B-01Y) BI-RADS CATEGORY  1: Negative. Electronically Signed   By: Lovey Newcomer M.D.   On: 10/09/2020 08:41   ROS: 14 pt review of systems performed and negative (unless mentioned in an HPI)  Objective: BP 130/77   Pulse 62   Temp 97.9 F (36.6 C) (Oral)   Ht 5' 2.25" (1.581 m)   Wt 196 lb (88.9 kg)   LMP 07/14/2021 (Exact Date)   SpO2 97%   BMI 35.56 kg/m  Gen: Afebrile. No acute distress. Nontoxic in appearance, well-developed, well-nourished,  pleasant obese female.  HENT: AT. Fertile. Bilateral TM visualized and normal in appearance, normal external auditory canal. MMM, no oral lesions, adequate dentition. Bilateral nares within normal limits. Throat without erythema, ulcerations or exudates. no Cough on exam, no hoarseness on exam. Eyes:Pupils Equal Round Reactive to light, Extraocular movements intact,  Conjunctiva without redness, discharge or icterus. Neck/lymp/endocrine: Supple,no lymphadenopathy, no thyromegaly CV: RRR no murmur, no edema, +2/4 P posterior tibialis pulses.  Chest: CTAB, no wheeze, rhonchi or crackles. normal Respiratory effort. good Air movement. Abd: Soft. flat. NTND. BS present. Fullness with discomfort left  Masses  palpated. No hepatosplenomegaly. No rebound tenderness or guarding. Skin: no rashes, purpura or petechiae. Warm and well-perfused. Skin intact. Neuro/Msk: Normal gait. PERLA. EOMi. Alert. Oriented x3.  Cranial nerves II through XII intact. Muscle strength 5/5 upper/lower extremity. DTRs equal bilaterally. Psych: Normal affect, dress and demeanor. Normal speech. Normal thought content and judgment. Breasts: breasts appear normal, symmetrical, no tenderness on exam, no suspicious masses, no skin or nipple changes or axillary nodes. GYN:  External genitalia within normal limits, normal hair distribution, no lesions. Urethral meatus normal, no lesions. Vaginal mucosa pink, moist, normal rugae, no lesions. No cystocele or rectocele. cervix without lesions, no discharge. Bimanual exam revealed normal uterus.  No bladder/suprapubic fullness, masses or tenderness. No cervical motion tenderness. No adnexal fullness. Anus and perineum within normal limits, no lesions.  No results found.  Assessment/plan: Sheila Nunez is a 45 y.o. female present for CPE Vitamin D deficiency - VITAMIN D 25 Hydroxy (Vit-D Deficiency, Fractures) Obesity (BMI 30-39.9) - Lipid panel Encounter for  gynecological examination with Papanicolaou smear of cervix - Cytology - PAP( [place]) with cotest Breast cancer screening by mammogram - MM 3D SCREEN BREAST BILATERAL; Future Diabetes mellitus screening - Comprehensive metabolic panel - Hemoglobin A1c Screening for deficiency anemia - CBC with Differential/Platelet Menses, irregular/enlarged uterus With changes in cycles and ?enlarged uterus on exam> abd/pelvic US ordered to rule out uterine mass or fibroid  - TSH Routine general medical examination at a health care facility Colonoscopy: No Fhx screen at 45> cologuard after jan 2023.  Mammogram: No fhx. Completed 09/2020. BiRads 1. Breast center. > ordered placed Cervical cancer screening: 08/09/2016. > completed today  w/ cotest Immunizations: tdap 06/2014, Influenza --> allergic, covid counseled Infectious disease screening: HIV and hep c  completed  DEXA: routine screen Patient was encouraged to exercise greater than 150 minutes a week. Patient was encouraged to choose a diet filled with fresh fruits and vegetables, and lean meats. AVS provided to patient today for education/recommendation on gender specific health and safety maintenance.  Return in about 1 year (around 07/24/2022) for CPE (30 min).  Orders Placed This Encounter  Procedures   MM 3D SCREEN BREAST BILATERAL   US Pelvic Complete With Transvaginal   CBC with Differential/Platelet   Comprehensive metabolic panel   Hemoglobin A1c   Lipid panel   VITAMIN D 25 Hydroxy (Vit-D Deficiency, Fractures)   TSH   No orders of the defined types were placed in this encounter.  Referral Orders  No referral(s) requested today     Electronically signed by: Howard Pouch, [place]

## 2021-07-20 NOTE — Patient Instructions (Signed)
Great to see you today.  I have refilled the medication(s) we provide.   If labs were collected, we will inform you of lab results once received either by echart message or telephone call.   - echart message- for normal results that have been seen by the patient already.   - telephone call: abnormal results or if patient has not viewed results in their echart. Health Maintenance, Female Adopting a healthy lifestyle and getting preventive care are important in promoting health and wellness. Ask your health care provider about: The right schedule for you to have regular tests and exams. Things you can do on your own to prevent diseases and keep yourself healthy. What should I know about diet, weight, and exercise? Eat a healthy diet  Eat a diet that includes plenty of vegetables, fruits, low-fat dairy products, and lean protein. Do not eat a lot of foods that are high in solid fats, added sugars, or sodium. Maintain a healthy weight Body mass index (BMI) is used to identify weight problems. It estimates body fat based on height and weight. Your health care provider can help determine your BMI and help you achieve or maintain a healthy weight. Get regular exercise Get regular exercise. This is one of the most important things you can do for your health. Most adults should: Exercise for at least 150 minutes each week. The exercise should increase your heart rate and make you sweat (moderate-intensity exercise). Do strengthening exercises at least twice a week. This is in addition to the moderate-intensity exercise. Spend less time sitting. Even light physical activity can be beneficial. Watch cholesterol and blood lipids Have your blood tested for lipids and cholesterol at 45 years of age, then have this test every 5 years. Have your cholesterol levels checked more often if: Your lipid or cholesterol levels are high. You are older than 45 years of age. You are at high risk for heart  disease. What should I know about cancer screening? Depending on your health history and family history, you may need to have cancer screening at various ages. This may include screening for: Breast cancer. Cervical cancer. Colorectal cancer. Skin cancer. Lung cancer. What should I know about heart disease, diabetes, and high blood pressure? Blood pressure and heart disease High blood pressure causes heart disease and increases the risk of stroke. This is more likely to develop in people who have high blood pressure readings, are of African descent, or are overweight. Have your blood pressure checked: Every 3-5 years if you are 56-7 years of age. Every year if you are 71 years old or older. Diabetes Have regular diabetes screenings. This checks your fasting blood sugar level. Have the screening done: Once every three years after age 26 if you are at a normal weight and have a low risk for diabetes. More often and at a younger age if you are overweight or have a high risk for diabetes. What should I know about preventing infection? Hepatitis B If you have a higher risk for hepatitis B, you should be screened for this virus. Talk with your health care provider to find out if you are at risk for hepatitis B infection. Hepatitis C Testing is recommended for: Everyone born from [birth year] through 1965. Anyone with known risk factors for hepatitis C. Sexually transmitted infections (STIs) Get screened for STIs, including gonorrhea and chlamydia, if: You are sexually active and are younger than 45 years of age. You are older than 44 years of age and your  health care provider tells you that you are at risk for this type of infection. Your sexual activity has changed since you were last screened, and you are at increased risk for chlamydia or gonorrhea. Ask your health care provider if you are at risk. Ask your health care provider about whether you are at high risk for HIV. Your health care provider  may recommend a prescription medicine to help prevent HIV infection. If you choose to take medicine to prevent HIV, you should first get tested for HIV. You should then be tested every 3 months for as long as you are taking the medicine. Pregnancy If you are about to stop having your period (premenopausal) and you may become pregnant, seek counseling before you get pregnant. Take 400 to 800 micrograms (mcg) of folic acid every day if you become pregnant. Ask for birth control (contraception) if you want to prevent pregnancy. Osteoporosis and menopause Osteoporosis is a disease in which the bones lose minerals and strength with aging. This can result in bone fractures. If you are 11 years old or older, or if you are at risk for osteoporosis and fractures, ask your health care provider if you should: Be screened for bone loss. Take a calcium or vitamin D supplement to lower your risk of fractures. Be given hormone replacement therapy (HRT) to treat symptoms of menopause. Follow these instructions at home: Lifestyle Do not use any products that contain nicotine or tobacco, such as cigarettes, e-cigarettes, and chewing tobacco. If you need help quitting, ask your health care provider. Do not use street drugs. Do not share needles. Ask your health care provider for help if you need support or information about quitting drugs. Alcohol use Do not drink alcohol if: Your health care provider tells you not to drink. You are pregnant, may be pregnant, or are planning to become pregnant. If you drink alcohol: Limit how much you use to 0-1 drink a day. Limit intake if you are breastfeeding. Be aware of how much alcohol is in your drink. In the [place] one drink equals one 12 oz bottle of beer (355 mL), one 5 oz glass of wine (148 mL), or one 1 oz glass of hard liquor (44 mL). General instructions Schedule regular health, dental, and eye exams. Stay current with your vaccines. Tell your health care  provider if: You often feel depressed. You have ever been abused or do not feel safe at home. Summary Adopting a healthy lifestyle and getting preventive care are important in promoting health and wellness. Follow your health care provider's instructions about healthy diet, exercising, and getting tested or screened for diseases. Follow your health care provider's instructions on monitoring your cholesterol and blood pressure. This information is not intended to replace advice given to you by your health care provider. Make sure you discuss any questions you have with your health care provider. Document Revised: 01/14/2021 Document Reviewed: 10/30/2018 Elsevier Patient Education  2022 [employer].

## 2021-07-21 LAB — CYTOLOGY - PAP
Comment: NEGATIVE
Diagnosis: NEGATIVE
High risk HPV: NEGATIVE

## 2021-07-27 ENCOUNTER — Ambulatory Visit
Admission: RE | Admit: 2021-07-27 | Discharge: 2021-07-27 | Disposition: A | Discharge status: Home / Self Care | Payer: BC Managed Care – PPO | Source: Ambulatory Visit | Attending: Family Medicine | Admitting: Family Medicine

## 2021-07-27 DIAGNOSIS — N852 Hypertrophy of uterus: Secondary | ICD-10-CM | POA: Diagnosis not present

## 2021-07-27 DIAGNOSIS — N926 Irregular menstruation, unspecified: Secondary | ICD-10-CM

## 2021-08-01 ENCOUNTER — Telehealth: Payer: Self-pay | Admitting: Family Medicine

## 2021-08-01 DIAGNOSIS — N800 Endometriosis of uterus: Secondary | ICD-10-CM

## 2021-08-01 DIAGNOSIS — N8003 Adenomyosis of the uterus: Secondary | ICD-10-CM | POA: Insufficient documentation

## 2021-08-01 NOTE — Telephone Encounter (Signed)
Please inform patient the following information: Her pelvic ultrasound did show a mildly enlarged uterus, described as "globular contour." The shape, size and texture of the myometrium (which is the muscle layer of the uterus) suggests a possible adenomyosis -This is consistent with people who have symptoms such as she does, with the heavier menstrual cycles and cramping/symptoms etc. -This condition is caused when the cells that usually line the uterus on the inside, start to grow in the middle or muscle layer of the uterus inappropriately.  This makes the uterus enlarged and causes heavier menstrual cycles and symptoms.  Team Demarious Kapur: Please place referral to gynecologist of her choice for further evaluation and treatment.  There are a few different options for treatment if this is adenomyosis-through gynecology.

## 2021-08-01 NOTE — Addendum Note (Signed)
Addended by: Howard Pouch A on: 08/01/2021 05:04 PM   Modules accepted: Orders

## 2021-08-01 NOTE — Telephone Encounter (Signed)
I was referring to the changes in cycle she reported being heavier than prior and lasting a little longer. If able to tolerate Aleve/naproxen, she can take this the day she starts her menstrual cycle, through the bleeding pattern which can be helpful. heating pads are also helpful with cramping.  There are quite a few DO gynecologist in the area. I will place the referral with those gynecologist names and one of them should be reaching out to her that is in her network.

## 2021-08-01 NOTE — Telephone Encounter (Signed)
Spoke with patient regarding results/recommendations,voiced understanding. She did not have a gynecologist of preferred choice at this time but would like them to be a DO. She also mentioned expecting her next cycle soon by the end of this week even though last one was 3 weeks ago. She does not experience heavy menstrual cycles but does have the discomfort. Would like to know if any at home recommendations for treatment such as heating pad.   Please review and advise

## 2021-08-02 NOTE — Telephone Encounter (Signed)
Spoke with patient regarding results/recommendations.

## 2021-08-03 ENCOUNTER — Encounter: Payer: Self-pay | Admitting: Family Medicine

## 2021-08-03 ENCOUNTER — Telehealth: Payer: Self-pay

## 2021-08-03 NOTE — Telephone Encounter (Signed)
Patient is scheduled with [facility] Dr.Greene tomorrow.   Nurse Assessment Nurse: Sheppard Plumber, RN, Estill Bamberg Date/Time (Eastern Time): 08/03/2021 2:27:58 PM Confirm and document reason for call. If symptomatic, describe symptoms. ---caller states she is having tingling and numbness in her right thigh. been happening for a couple years. when she woke up this morning when she was flat on her back. if she is siting it will start. has low back pain Does the patient have any new or worsening symptoms? ---Yes Will a triage be completed? ---Yes Related visit to physician within the last 2 weeks? ---No Does the PT have any chronic conditions? (i.e. diabetes, asthma, this includes High risk factors for pregnancy, etc.) ---No Is the patient pregnant or possibly pregnant? (Ask all females between the ages of 82-55) ---No Is this a behavioral health or substance abuse call? ---No Guidelines Guideline Title Affirmed Question Affirmed Notes Nurse Date/Time (Eastern Time) Back Pain Numbness in a leg or foot (i.e., loss of sensation) [place], RN, Estill Bamberg 08/03/2021 2:30:26 PM Disp. Time Eilene Ghazi Time) Disposition Final User 08/03/2021 2:34:40 PM See PCP within 24 Hours Yes [place], RN, Estill Bamberg PLEASE NOTE: All timestamps contained within this report are represented as [place] Standard Time. CONFIDENTIALTY NOTICE: This fax transmission is intended only for the addressee. It contains information that is legally privileged, confidential or otherwise protected from use or disclosure. If you are not the intended recipient, you are strictly prohibited from reviewing, disclosing, copying using or disseminating any of this information or taking any action in reliance on or regarding this information. If you have received this fax in error, please notify us immediately by telephone so that we can arrange for its return to [place]. Phone: [phone], Toll-Free: [phone], Fax: [phone] Page: 2 of 2 Call Id:  [id number] [place] Disagree/Comply Comply Caller Understands Yes PreDisposition Call Doctor Care Advice Given Per Guideline SEE PCP WITHIN 24 HOURS: * IF OFFICE WILL BE OPEN: You need to be examined within the next 24 hours. Call your doctor (or NP/PA) when the office opens and make an appointment. CARE ADVICE given per Back Pain (Adult) guideline. * You become worse CALL BACK IF: Referrals REFERRED TO PCP OFFIC

## 2021-08-03 NOTE — Telephone Encounter (Signed)
noted 

## 2021-08-04 ENCOUNTER — Ambulatory Visit: Payer: BC Managed Care – PPO | Admitting: Internal Medicine

## 2021-08-04 ENCOUNTER — Encounter: Payer: Self-pay | Admitting: Internal Medicine

## 2021-08-04 ENCOUNTER — Other Ambulatory Visit: Payer: Self-pay

## 2021-08-04 VITALS — BP 116/64 | HR 61 | Temp 98.7°F | Resp 16 | Ht 62.25 in | Wt 199.0 lb

## 2021-08-04 DIAGNOSIS — G5711 Meralgia paresthetica, right lower limb: Secondary | ICD-10-CM

## 2021-08-04 NOTE — Progress Notes (Signed)
Subjective:    Patient ID: Sheila Nunez, female    DOB: [birth year], 45 y.o.   MRN: [id number]  DOS:  08/04/2021 Type of visit - description: Acute  Several years history of episodic "tingling" at the lateral aspect of the R thigh. I asked her to describe the tingling and she said it is like the area is "sleeping" Symptoms sometimes are  positional for instance triggered by when she is laying down in bed or sitting for prolonged period of time.  She denies neck or back pain Denies any other paresthesias.    Review of Systems See above   Past Medical History:  Diagnosis Date   Allergy    Dr. Allyson Sabal   Atopic dermatitis 04/01/2021   Chronic allergic conjunctivitis 04/01/2021   Depression    Eczema    Dr. 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 Gershon Crane    Allergies as of 08/04/2021       Reactions   Zithromax [azithromycin] Hives        Medication List        Accurate as of August 04, 2021  9:41 AM. If you have any questions, ask your nurse or doctor.          Azelastine HCl 137 MCG/SPRAY Soln Place 1 spray into both nostrils 2 (two) times daily as needed.   cholecalciferol 1000 units tablet Commonly known as: VITAMIN D Take 2,000 Units by mouth daily.   EQ FIBER POWDER PO Take by mouth. Herbal Prima   fluocinonide ointment 0.05 % Commonly known as: LIDEX SMARTSIG:Sparingly Topical Twice Daily PRN   fluticasone 50 MCG/ACT nasal spray Commonly known as: FLONASE 1 spray in each nostril   HAIR/SKIN/NAILS PO Take by mouth.   ketotifen 0.025 % ophthalmic solution Commonly known as: ZADITOR 1gtt into each eye   levocetirizine 5 MG tablet Commonly known as: XYZAL Take 5 mg by mouth every evening.   multivitamin capsule Take 1 capsule by mouth daily.           Objective:    Physical Exam Skin:         Comments: Area of perceived paresthesias   BP 116/64 (BP Location: Left Arm, Patient Position: Sitting, Cuff Size: Small)   Pulse 61   Temp 98.7 F (37.1 C) (Oral)   Resp 16   Ht 5' 2.25" (1.581 m)   Wt 199 lb (90.3 kg)   LMP 07/14/2021 (Exact Date)   SpO2 96%   BMI 36.11 kg/m  General:   Well developed, NAD, BMI noted. HEENT:  Normocephalic . Face symmetric, atraumatic MSK: Neck is full range of motion. Lumbar, thoracic and cervical spine no TTP Rotation of the hips and trochanteric bursas: Normal Lower extremities: no pretibial edema bilaterally.  R groin without mass or lymph nodes Skin: Not pale. Not jaundice Neurologic:  alert & oriented X3.  Speech normal, gait appropriate for age and unassisted.  DTRs symmetric, strength symmetric Psych--  Cognition and judgment appear intact.  Cooperative with normal attention span and concentration.  Behavior appropriate. No anxious or depressed appearing.      Assessment     45 year old female, PMH includes obesity, vitamin D deficiency, allergies, presents with:  Meralgia paresthetica I am very suspicious that symptoms are related to meralgia paresthetica, I explained pt  what  that is, treatment is consistent weight  loss. If not better, she may like to pursue further investigation including a formal neurology evaluation, labs, NCV etc. At this point that does not seem to be necessary. She verbalized understanding.   This visit occurred during the SARS-CoV-2 public health emergency.  Safety protocols were in place, including screening questions prior to the visit, additional usage of staff PPE, and extensive cleaning of exam room while observing appropriate contact time as indicated for disinfecting solutions.

## 2021-08-04 NOTE — Patient Instructions (Signed)
MERALGIA PARESTHETICA

## 2021-08-10 ENCOUNTER — Ambulatory Visit: Payer: BC Managed Care – PPO | Admitting: Family Medicine

## 2021-09-21 ENCOUNTER — Encounter: Payer: Self-pay | Admitting: Family Medicine

## 2021-09-23 ENCOUNTER — Other Ambulatory Visit: Payer: Self-pay

## 2021-09-23 ENCOUNTER — Encounter: Payer: Self-pay | Admitting: Family Medicine

## 2021-09-23 ENCOUNTER — Telehealth: Payer: Self-pay | Admitting: Family Medicine

## 2021-09-23 ENCOUNTER — Ambulatory Visit: Payer: BC Managed Care – PPO | Admitting: Family Medicine

## 2021-09-23 VITALS — BP 102/71 | HR 92 | Temp 98.2°F | Ht 62.0 in | Wt 196.0 lb

## 2021-09-23 DIAGNOSIS — R051 Acute cough: Secondary | ICD-10-CM | POA: Diagnosis not present

## 2021-09-23 DIAGNOSIS — J029 Acute pharyngitis, unspecified: Secondary | ICD-10-CM | POA: Diagnosis not present

## 2021-09-23 DIAGNOSIS — J069 Acute upper respiratory infection, unspecified: Secondary | ICD-10-CM

## 2021-09-23 DIAGNOSIS — B9689 Other specified bacterial agents as the cause of diseases classified elsewhere: Secondary | ICD-10-CM | POA: Diagnosis not present

## 2021-09-23 LAB — POCT INFLUENZA A/B
Influenza A, POC: NEGATIVE
Influenza B, POC: NEGATIVE

## 2021-09-23 LAB — POCT RAPID STREP A (OFFICE): Rapid Strep A Screen: NEGATIVE

## 2021-09-23 MED ORDER — DOXYCYCLINE HYCLATE 100 MG PO TABS: 100.0000 mg | ORAL_TABLET | Freq: Two times a day (BID) | ORAL | 0 refills | Status: DC

## 2021-09-23 MED ORDER — PREDNISONE 20 MG PO TABS: 40.0000 mg | ORAL_TABLET | Freq: Every day | ORAL | 0 refills | Status: DC

## 2021-09-23 MED ORDER — HYDROCODONE BIT-HOMATROP MBR 5-1.5 MG/5ML PO SOLN: 5.0000 mL | Freq: Three times a day (TID) | ORAL | 0 refills | Status: DC | PRN

## 2021-09-23 MED ORDER — BENZONATATE 200 MG PO CAPS: 200.0000 mg | ORAL_CAPSULE | Freq: Two times a day (BID) | ORAL | 0 refills | Status: DC | PRN

## 2021-09-23 NOTE — Telephone Encounter (Signed)
Unfortunately, there is no other pills available would like Tessalon Perles.  She may want to see what they are out-of-pocket cost or if available good Rx coupon? Mucinex DM I encouraged her to purchase over-the-counter will also help with cough.  Lastly, I did call in a cough syrup.  The cough syrup is called Hycodan cough syrup and it does have a small amount of hydrocodone and this cough syrup, therefore it could make her sleepy.

## 2021-09-23 NOTE — Progress Notes (Signed)
This visit occurred during the SARS-CoV-2 public health emergency.  Safety protocols were in place, including screening questions prior to the visit, additional usage of staff PPE, and extensive cleaning of exam room while observing appropriate contact time as indicated for disinfecting solutions.    Sheila Nunez , [birth year], 45 y.o., female MRN: [id number] Patient Care Team    Relationship Specialty Notifications Start End  Ma Hillock, DO PCP - General Family Medicine  07/19/15   Tiajuana Amass, MD Referring Physician Allergy and Immunology  05/25/16   [name], DO Consulting Physician Sports Medicine  05/06/18     Chief Complaint  Patient presents with   Nasal Congestion    Pt c/o nasal drainage, nasal congestion, sore throat, neck pain, productive cough x 4 days    Subjective: Pt presents for an OV with complaints of nasal drainage w/ congestion, sore throat, swollen tender glands and productive cough.  She does not desire flu vaccines.  Pt has tried mucinex to ease their symptoms.  She denies fever, chills, nausea, vomit, diarrhea.  No sick contacts.   Depression screen [facility] 2/9 09/23/2021 08/04/2021 07/20/2021 04/01/2021 06/17/2019  Decreased Interest 0 0 0 0 0  Down, Depressed, Hopeless 0 0 0 0 0  PHQ - 2 Score 0 0 0 0 0    Allergies  Allergen Reactions   Zithromax [Azithromycin] Hives   Social History   Social History Narrative   Single. 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   Never smoker, occ etoh, no drugs.       Past Medical History:  Diagnosis Date   Allergy    Dr. Allyson Sabal   Atopic dermatitis 04/01/2021   Chronic allergic conjunctivitis 04/01/2021   Depression    Eczema    Dr. 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 Gershon Crane   Family History  Problem Relation Age of Onset   GER disease Brother    Cancer Neg Hx    Allergies as of 09/23/2021       Reactions   Zithromax [azithromycin] Hives        Medication List        Accurate as of September 23, 2021  4:48 PM. If you have any questions, ask your nurse or doctor.          Azelastine HCl 137 MCG/SPRAY Soln Place 1 spray into both nostrils 2 (two) times daily as needed.   benzonatate 200 MG capsule Commonly known as: TESSALON Take 1 capsule (200 mg total) by mouth 2 (two) times daily as needed for cough. Started by: Howard Pouch, DO   cholecalciferol 1000 units tablet Commonly known as: VITAMIN D Take 2,000 Units by mouth daily.   doxycycline 100 MG tablet Commonly known as: VIBRA-TABS Take 1 tablet (100 mg total) by mouth 2 (two) times daily. Started by: Howard Pouch, DO   EQ FIBER POWDER PO Take by mouth. Herbal Prima   fluocinonide ointment 0.05 % Commonly known as: LIDEX SMARTSIG:Sparingly Topical Twice Daily PRN   fluticasone 50 MCG/ACT nasal spray Commonly known as: FLONASE 1 spray in each nostril   HAIR/SKIN/NAILS PO Take by mouth.   HYDROcodone bit-homatropine 5-1.5 MG/5ML syrup Commonly known as: HYCODAN  Take 5 mLs by mouth every 8 (eight) hours as needed for cough. Started by: Howard Pouch, DO   ketotifen 0.025 % ophthalmic solution Commonly known as: ZADITOR 1gtt into each eye   levocetirizine 5 MG tablet Commonly known as: XYZAL Take 5 mg by mouth every evening.   multivitamin capsule Take 1 capsule by mouth daily.   predniSONE 20 MG tablet Commonly known as: DELTASONE Take 2 tablets (40 mg total) by mouth daily with breakfast. Started by: Howard Pouch, DO        All past medical history, surgical history, allergies, family history, immunizations andmedications were updated in the EMR today and reviewed under the history and medication portions of their EMR.     ROS: Negative,  with the exception of above mentioned in HPI   Objective:  BP 102/71   Pulse 92   Temp 98.2 F (36.8 C) (Oral)   Ht 5' 2"  (1.575 m)   Wt 196 lb (88.9 kg)   SpO2 96%   BMI 35.85 kg/m  Body mass index is 35.85 kg/m. Gen: Afebrile. No acute distress. Nontoxic in appearance, well developed, well nourished.  HENT: AT. [place]. Bilateral TM visualized w/out erythema. MMM, no oral lesions. Bilateral nares with swelling and drainage. Throat without erythema or exudates. Cough present. Eyes:Pupils Equal Round Reactive to light, Extraocular movements intact,  Conjunctiva without redness, discharge or icterus. Neck/lymp/endocrine: Supple,mild left lymphadenopathy CV: RRR  Chest: CTAB, no wheeze or crackles. Good air movement, normal resp effort.  Skin: no rashes, purpura or petechiae.  Neuro: Normal gait. PERLA. EOMi. Alert. Oriented x3  Psych: Normal affect, dress and demeanor. Normal speech. Normal thought content and judgment.  No results found. No results found. Results for orders placed or performed in visit on 09/23/21 (from the past 24 hour(s))  POCT Influenza A/B     Status: None   Collection Time: 09/23/21  4:34 PM  Result Value Ref Range   Influenza A, POC Negative Negative   Influenza B, POC Negative Negative  POCT rapid strep A     Status: None   Collection Time: 09/23/21  4:35 PM  Result Value Ref Range   Rapid Strep A Screen Negative Negative    Assessment/Plan: Sheila Nunez is a 45 y.o. female present for OV for  Sore throat/cough/ Bacterial upper respiratory infection - POCT Influenza A/B - POCT rapid strep A - Upper Respiratory Culture Rest, hydrate.  Start OTC flonase, mucinex (DM if cough), nettie pot or nasal saline.  Doxy and pred prescribed, take until completed.  Tessalon perles for cough.  If cough present it can last up to 6-8 weeks.  F/U 2 weeks if not improved.   Reviewed expectations re: course of current medical issues. Discussed self-management of  symptoms. Outlined signs and symptoms indicating need for more acute intervention. Patient verbalized understanding and all questions were answered. Patient received an After-Visit Summary.    Orders Placed This Encounter  Procedures   Upper Respiratory Culture   POCT Influenza A/B   POCT rapid strep A   Meds ordered this encounter  Medications   predniSONE (DELTASONE) 20 MG tablet    Sig: Take 2 tablets (40 mg total) by mouth daily with breakfast.    Dispense:  10 tablet    Refill:  0   doxycycline (VIBRA-TABS) 100 MG tablet    Sig: Take 1 tablet (100 mg total) by mouth 2 (two) times daily.    Dispense:  20 tablet    Refill:  0   benzonatate (TESSALON) 200 MG capsule    Sig: Take 1 capsule (200 mg total) by mouth 2 (two) times daily as needed for cough.    Dispense:  20 capsule    Refill:  0    Referral Orders  No referral(s) requested today     Note is dictated utilizing voice recognition software. Although note has been proof read prior to signing, occasional typographical errors still can be missed. If any questions arise, please do not hesitate to call for verification.   electronically signed by:  Howard Pouch, DO  [place]

## 2021-09-23 NOTE — Patient Instructions (Addendum)
Great to see you today.  I have refilled the medication(s) we provide.   If labs were collected, we will inform you of lab results once received either by echart message or telephone call.   - echart message- for normal results that have been seen by the patient already.   - telephone call: abnormal results or if patient has not viewed results in their echart.   Sinusitis, Adult Sinusitis is inflammation of your sinuses. Sinuses are hollow spaces in the bones around your face. Your sinuses are located: Around your eyes. In the middle of your forehead. Behind your nose. In your cheekbones. Mucus normally drains out of your sinuses. When your nasal tissues become inflamed or swollen, mucus can become trapped or blocked. This allows bacteria, viruses, and fungi to grow, which leads to infection. Most infections of the sinuses are caused by a virus. Sinusitis can develop quickly. It can last for up to 4 weeks (acute) or for more than 12 weeks (chronic). Sinusitis often develops after a cold. What are the causes? This condition is caused by anything that creates swelling in the sinuses or stops mucus from draining. This includes: Allergies. Asthma. Infection from bacteria or viruses. Deformities or blockages in your nose or sinuses. Abnormal growths in the nose (nasal polyps). Pollutants, such as chemicals or irritants in the air. Infection from fungi (rare). What increases the risk? You are more likely to develop this condition if you: Have a weak body defense system (immune system). Do a lot of swimming or diving. Overuse nasal sprays. Smoke. What are the signs or symptoms? The main symptoms of this condition are pain and a feeling of pressure around the affected sinuses. Other symptoms include: Stuffy nose or congestion. Thick drainage from your nose. Swelling and warmth over the affected sinuses. Headache. Upper toothache. A cough that may get worse at night. Extra mucus that  collects in the throat or the back of the nose (postnasal drip). Decreased sense of smell and taste. Fatigue. A fever. Sore throat. Bad breath. How is this diagnosed? This condition is diagnosed based on: Your symptoms. Your medical history. A physical exam. Tests to find out if your condition is acute or chronic. This may include: Checking your nose for nasal polyps. Viewing your sinuses using a device that has a light (endoscope). Testing for allergies or bacteria. Imaging tests, such as an MRI or CT scan. In rare cases, a bone biopsy may be done to rule out more serious types of fungal sinus disease. How is this treated? Treatment for sinusitis depends on the cause and whether your condition is chronic or acute. If caused by a virus, your symptoms should go away on their own within 10 days. You may be given medicines to relieve symptoms. They include: Medicines that shrink swollen nasal passages (topical intranasal decongestants). Medicines that treat allergies (antihistamines). A spray that eases inflammation of the nostrils (topical intranasal corticosteroids). Rinses that help get rid of thick mucus in your nose (nasal saline washes). If caused by bacteria, your health care provider may recommend waiting to see if your symptoms improve. Most bacterial infections will get better without antibiotic medicine. You may be given antibiotics if you have: A severe infection. A weak immune system. If caused by narrow nasal passages or nasal polyps, you may need to have surgery. Follow these instructions at home: Medicines Take, use, or apply over-the-counter and prescription medicines only as told by your health care provider. These may include nasal sprays. If you  were prescribed an antibiotic medicine, take it as told by your health care provider. Do not stop taking the antibiotic even if you start to feel better. Hydrate and humidify  Drink enough fluid to keep your urine pale  yellow. Staying hydrated will help to thin your mucus. Use a cool mist humidifier to keep the humidity level in your home above 50%. Inhale steam for 10-15 minutes, 3-4 times a day, or as told by your health care provider. You can do this in the bathroom while a hot shower is running. Limit your exposure to cool or dry air. Rest Rest as much as possible. Sleep with your head raised (elevated). Make sure you get enough sleep each night. General instructions  Apply a warm, moist washcloth to your face 3-4 times a day or as told by your health care provider. This will help with discomfort. Wash your hands often with soap and water to reduce your exposure to germs. If soap and water are not available, use hand sanitizer. Do not smoke. Avoid being around people who are smoking (secondhand smoke). Keep all follow-up visits as told by your health care provider. This is important. Contact a health care provider if: You have a fever. Your symptoms get worse. Your symptoms do not improve within 10 days. Get help right away if: You have a severe headache. You have persistent vomiting. You have severe pain or swelling around your face or eyes. You have vision problems. You develop confusion. Your neck is stiff. You have trouble breathing. Summary Sinusitis is soreness and inflammation of your sinuses. Sinuses are hollow spaces in the bones around your face. This condition is caused by nasal tissues that become inflamed or swollen. The swelling traps or blocks the flow of mucus. This allows bacteria, viruses, and fungi to grow, which leads to infection. If you were prescribed an antibiotic medicine, take it as told by your health care provider. Do not stop taking the antibiotic even if you start to feel better. Keep all follow-up visits as told by your health care provider. This is important. This information is not intended to replace advice given to you by your health care provider. Make sure you  discuss any questions you have with your health care provider. Document Revised: 04/08/2018 Document Reviewed: 04/08/2018 Elsevier Patient Education  2022 [employer].

## 2021-09-23 NOTE — Telephone Encounter (Signed)
Pt called and said the pharmacy said that the benzonatate isnt covered by her insurance and wanted to know if we wanted to send something else in. Please advise

## 2021-09-23 NOTE — Telephone Encounter (Signed)
Spoke with pt regarding medication and instructions.

## 2021-09-23 NOTE — Telephone Encounter (Signed)
Please advise 

## 2021-09-25 LAB — CULTURE, UPPER RESPIRATORY
MICRO NUMBER:: 12598635
SPECIMEN QUALITY:: ADEQUATE

## 2021-10-07 ENCOUNTER — Ambulatory Visit
Admission: RE | Admit: 2021-10-07 | Discharge: 2021-10-07 | Disposition: A | Discharge status: Home / Self Care | Payer: BC Managed Care – PPO | Source: Ambulatory Visit | Attending: Family Medicine | Admitting: Family Medicine

## 2021-10-07 ENCOUNTER — Other Ambulatory Visit: Payer: Self-pay

## 2021-10-07 DIAGNOSIS — Z1231 Encounter for screening mammogram for malignant neoplasm of breast: Secondary | ICD-10-CM | POA: Diagnosis not present

## 2021-10-10 ENCOUNTER — Other Ambulatory Visit: Payer: Self-pay | Admitting: Family Medicine

## 2021-10-10 DIAGNOSIS — R928 Other abnormal and inconclusive findings on diagnostic imaging of breast: Secondary | ICD-10-CM

## 2021-10-27 ENCOUNTER — Other Ambulatory Visit: Payer: Self-pay | Admitting: Family Medicine

## 2021-10-27 ENCOUNTER — Ambulatory Visit
Admission: RE | Admit: 2021-10-27 | Discharge: 2021-10-27 | Disposition: A | Discharge status: Home / Self Care | Payer: BC Managed Care – PPO | Source: Ambulatory Visit | Attending: Family Medicine | Admitting: Family Medicine

## 2021-10-27 DIAGNOSIS — R928 Other abnormal and inconclusive findings on diagnostic imaging of breast: Secondary | ICD-10-CM | POA: Diagnosis not present

## 2021-10-27 DIAGNOSIS — R922 Inconclusive mammogram: Secondary | ICD-10-CM | POA: Diagnosis not present

## 2021-10-28 ENCOUNTER — Encounter: Payer: Self-pay | Admitting: Family Medicine

## 2021-10-28 DIAGNOSIS — R928 Other abnormal and inconclusive findings on diagnostic imaging of breast: Secondary | ICD-10-CM | POA: Insufficient documentation

## 2021-12-23 ENCOUNTER — Other Ambulatory Visit: Payer: Self-pay

## 2021-12-26 ENCOUNTER — Other Ambulatory Visit: Payer: Self-pay

## 2021-12-26 ENCOUNTER — Ambulatory Visit: Payer: BC Managed Care – PPO | Admitting: Family Medicine

## 2021-12-26 VITALS — BP 117/79 | HR 60 | Temp 97.6°F | Resp 12 | Ht 62.0 in | Wt 199.2 lb

## 2021-12-26 DIAGNOSIS — E669 Obesity, unspecified: Secondary | ICD-10-CM | POA: Diagnosis not present

## 2021-12-26 DIAGNOSIS — E782 Mixed hyperlipidemia: Secondary | ICD-10-CM | POA: Diagnosis not present

## 2021-12-26 DIAGNOSIS — Z1211 Encounter for screening for malignant neoplasm of colon: Secondary | ICD-10-CM

## 2021-12-26 LAB — LIPID PANEL
Cholesterol: 201 mg/dL — ABNORMAL HIGH (ref 0–200)
HDL: 57.5 mg/dL (ref >39.00)
LDL Cholesterol: 131 mg/dL — ABNORMAL HIGH (ref 0–99)
NonHDL: 143.68
Total CHOL/HDL Ratio: 3
Triglycerides: 61 mg/dL (ref 0.0–149.0)
VLDL: 12.2 mg/dL (ref 0.0–40.0)

## 2021-12-26 NOTE — Patient Instructions (Signed)

## 2021-12-26 NOTE — Progress Notes (Signed)
This visit occurred during the SARS-CoV-2 public health emergency.  Safety protocols were in place, including screening questions prior to the visit, additional usage of staff PPE, and extensive cleaning of exam room while observing appropriate contact time as indicated for disinfecting solutions.    Sheila Nunez , 22-Jan-1976, 46 y.o., female MRN: [id number] Patient Care Team    Relationship Specialty Notifications Start End  Ma Hillock, DO PCP - General Family Medicine  07/19/15   Tiajuana Amass, MD Referring Physician Allergy and Immunology  05/25/16   [name], DO Consulting Physician Sports Medicine  05/06/18     Chief Complaint  Patient presents with   follow up cholesterol     Subjective: Pt presents for an OV to discussed her elevated cholesterol levels discovered at her CPE. She reports she did not change much since her CPE 3 mos ago. She is starting a new exercise program tomorrow. She believes the levated LDL og 161 was due to her not exercising as she had in the past. She does not eat meat and uses plant based butters.   Depression screen [facility] 2/9 09/23/2021 08/04/2021 07/20/2021 04/01/2021 06/17/2019  Decreased Interest 0 0 0 0 0  Down, Depressed, Hopeless 0 0 0 0 0  PHQ - 2 Score 0 0 0 0 0    Allergies  Allergen Reactions   Zithromax [Azithromycin] Hives   Social History   Social History Narrative   Single. 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   Never smoker, occ etoh, no drugs.       Past Medical History:  Diagnosis Date   Allergy    Dr. Allyson Sabal   Atopic dermatitis 04/01/2021   Chronic allergic conjunctivitis 04/01/2021   Depression    Eczema    Dr. 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 Gershon Crane   Family  History  Problem Relation Age of Onset   GER disease Brother    Cancer Neg Hx    Allergies as of 12/26/2021       Reactions   Zithromax [azithromycin] Hives        Medication List        Accurate as of December 26, 2021  9:31 AM. If you have any questions, ask your nurse or doctor.          STOP taking these medications    benzonatate 200 MG capsule Commonly known as: TESSALON Stopped by: Howard Pouch, DO   doxycycline 100 MG tablet Commonly known as: VIBRA-TABS Stopped by: Howard Pouch, DO   HYDROcodone bit-homatropine 5-1.5 MG/5ML syrup Commonly known as: HYCODAN Stopped by: Howard Pouch, DO   predniSONE 20 MG tablet Commonly known as: DELTASONE Stopped by: Howard Pouch, DO       TAKE these medications    Azelastine HCl 137 MCG/SPRAY Soln Place 1 spray into both nostrils 2 (two) times daily as needed.   cholecalciferol 1000 units tablet Commonly known as: VITAMIN D Take 2,000 Units by mouth daily.   EQ FIBER POWDER PO Take by mouth. Herbal Prima   fluocinonide ointment 0.05 % Commonly known as: LIDEX SMARTSIG:Sparingly Topical Twice Daily PRN   fluticasone 50 MCG/ACT nasal spray Commonly known as: FLONASE 1 spray  in each nostril   HAIR/SKIN/NAILS PO Take by mouth.   ketotifen 0.025 % ophthalmic solution Commonly known as: ZADITOR 1gtt into each eye   levocetirizine 5 MG tablet Commonly known as: XYZAL Take 5 mg by mouth every evening.   multivitamin capsule Take 1 capsule by mouth daily.        All past medical history, surgical history, allergies, family history, immunizations andmedications were updated in the EMR today and reviewed under the history and medication portions of their EMR.     ROS Negative, with the exception of above mentioned in HPI   Objective:  BP 117/79 (BP Location: Left Arm, Cuff Size: Large)    Pulse 60    Temp 97.6 F (36.4 C) (Oral)    Resp 12    Ht 5' 2"  (1.575 m)    Wt 199 lb 3.2 oz (90.4 kg)    LMP  12/17/2021    SpO2 100%    BMI 36.43 kg/m  Body mass index is 36.43 kg/m. Physical Exam Vitals and nursing note reviewed.  Constitutional:      General: She is not in acute distress.    Appearance: Normal appearance. She is obese. She is not ill-appearing, toxic-appearing or diaphoretic.  HENT:     Head: Normocephalic and atraumatic.  Eyes:     General: No scleral icterus.       Right eye: No discharge.        Left eye: No discharge.     Extraocular Movements: Extraocular movements intact.     Conjunctiva/sclera: Conjunctivae normal.     Pupils: Pupils are equal, round, and reactive to light.  Cardiovascular:     Rate and Rhythm: Normal rate and regular rhythm.  Pulmonary:     Effort: Pulmonary effort is normal. No respiratory distress.     Breath sounds: Normal breath sounds. No wheezing, rhonchi or rales.  Musculoskeletal:     Cervical back: Neck supple. No tenderness.  Lymphadenopathy:     Cervical: No cervical adenopathy.  Skin:    General: Skin is warm and dry.     Coloration: Skin is not jaundiced or pale.     Findings: No erythema or rash.  Neurological:     Mental Status: She is alert and oriented to person, place, and time. Mental status is at baseline.     Motor: No weakness.     Gait: Gait normal.  Psychiatric:        Mood and Affect: Mood normal.        Behavior: Behavior normal.        Thought Content: Thought content normal.        Judgment: Judgment normal.     No results found. No results found. No results found for this or any previous visit (from the past 24 hour(s)).  Assessment/Plan: Sheila Nunez is a 46 y.o. 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  Discussed medications and possible supplements.  - Lipid panel F/u dependent on labs.   Colon cancer screening - Cologuard  Reviewed expectations re: course of current medical issues. Discussed self-management of  symptoms. Outlined signs and symptoms indicating need for more acute intervention. Patient verbalized understanding and all questions were answered. Patient received an After-Visit Summary.    No orders of the defined types were placed in this encounter.  No orders of the defined types were placed in this encounter.  Referral Orders  No referral(s) requested today  Note is dictated utilizing voice recognition software. Although note has been proof read prior to signing, occasional typographical errors still can be missed. If any questions arise, please do not hesitate to call for verification.   electronically signed by:  Howard Pouch, DO  [place]

## 2022-01-03 DIAGNOSIS — Z1211 Encounter for screening for malignant neoplasm of colon: Secondary | ICD-10-CM | POA: Diagnosis not present

## 2022-01-11 LAB — COLOGUARD: COLOGUARD: NEGATIVE

## 2022-04-14 ENCOUNTER — Encounter: Payer: Self-pay | Admitting: Family Medicine

## 2022-04-14 ENCOUNTER — Ambulatory Visit: Payer: 59 | Admitting: Family Medicine

## 2022-04-14 VITALS — BP 107/76 | HR 74 | Temp 98.6°F | Ht 62.0 in | Wt 200.0 lb

## 2022-04-14 DIAGNOSIS — J0100 Acute maxillary sinusitis, unspecified: Secondary | ICD-10-CM

## 2022-04-14 LAB — POC COVID19 BINAXNOW: SARS Coronavirus 2 Ag: NEGATIVE

## 2022-04-14 MED ORDER — DOXYCYCLINE HYCLATE 100 MG PO TABS: 100.0000 mg | ORAL_TABLET | Freq: Two times a day (BID) | ORAL | 0 refills | Status: DC

## 2022-04-14 MED ORDER — IPRATROPIUM BROMIDE 0.06 % NA SOLN: 2.0000 | Freq: Four times a day (QID) | NASAL | 2 refills | Status: DC

## 2022-04-14 NOTE — Addendum Note (Signed)
Addended by: Kavin Leech on: 04/14/2022 02:42 PM   Modules accepted: Orders

## 2022-04-14 NOTE — Patient Instructions (Addendum)
No follow-ups on file.        Great to see you today.  I have refilled the medication(s) we provide.   If labs were collected, we will inform you of lab results once received either by echart message or telephone call.   - echart message- for normal results that have been seen by the patient already.   - telephone call: abnormal results or if patient has not viewed results in their echart.   Sinus Infection, Adult A sinus infection is soreness and swelling (inflammation) of your sinuses. Sinuses are hollow spaces in the bones around your face. They are located: Around your eyes. In the middle of your forehead. Behind your nose. In your cheekbones. Your sinuses and nasal passages are lined with a fluid called mucus. Mucus drains out of your sinuses. Swelling can trap mucus in your sinuses. This lets germs (bacteria, virus, or fungus) grow, which leads to infection. Most of the time, this condition is caused by a virus. What are the causes? Allergies. Asthma. Germs. Things that block your nose or sinuses. Growths in the nose (nasal polyps). Chemicals or irritants in the air. A fungus. This is rare. What increases the risk? Having a weak body defense system (immune system). Doing a lot of swimming or diving. Using nasal sprays too much. Smoking. What are the signs or symptoms? The main symptoms of this condition are pain and a feeling of pressure around the sinuses. Other symptoms include: Stuffy nose (congestion). This may make it hard to breathe through your nose. Runny nose (drainage). Soreness, swelling, and warmth in the sinuses. A cough that may get worse at night. Being unable to smell and taste. Mucus that collects in the throat or the back of the nose (postnasal drip). This may cause a sore throat or bad breath. Being very tired (fatigued). A fever. How is this diagnosed? Your symptoms. Your medical history. A physical exam. Tests to find out if your condition  is short-term (acute) or long-term (chronic). Your doctor may: Check your nose for growths (polyps). Check your sinuses using a tool that has a light on one end (endoscope). Check for allergies or germs. Do imaging tests, such as an MRI or CT scan. How is this treated? Treatment for this condition depends on the cause and whether it is short-term or long-term. If caused by a virus, your symptoms should go away on their own within 10 days. You may be given medicines to relieve symptoms. They include: Medicines that shrink swollen tissue in the nose. A spray that treats swelling of the nostrils. Rinses that help get rid of thick mucus in your nose (nasal saline washes). Medicines that treat allergies (antihistamines). Over-the-counter pain relievers. If caused by bacteria, your doctor may wait to see if you will get better without treatment. You may be given antibiotic medicine if you have: A very bad infection. A weak body defense system. If caused by growths in the nose, surgery may be needed. Follow these instructions at home: Medicines Take, use, or apply over-the-counter and prescription medicines only as told by your doctor. These may include nasal sprays. If you were prescribed an antibiotic medicine, take it as told by your doctor. Do not stop taking it even if you start to feel better. Hydrate and humidify  Drink enough water to keep your pee (urine) pale yellow. Use a cool mist humidifier to keep the humidity level in your home above 50%. Breathe in steam for 10-15 minutes, 3-4 times a  day, or as told by your doctor. You can do this in the bathroom while a hot shower is running. Try not to spend time in cool or dry air. Rest Rest as much as you can. Sleep with your head raised (elevated). Make sure you get enough sleep each night. General instructions  Put a warm, moist washcloth on your face 3-4 times a day, or as often as told by your doctor. Use nasal saline washes as  often as told by your doctor. Wash your hands often with soap and water. If you cannot use soap and water, use hand sanitizer. Do not smoke. Avoid being around people who are smoking (secondhand smoke). Keep all follow-up visits. Contact a doctor if: You have a fever. Your symptoms get worse. Your symptoms do not get better within 10 days. Get help right away if: You have a very bad headache. You cannot stop vomiting. You have very bad pain or swelling around your face or eyes. You have trouble seeing. You feel confused. Your neck is stiff. You have trouble breathing. These symptoms may be an emergency. Get help right away. Call 911. Do not wait to see if the symptoms will go away. Do not drive yourself to the hospital. Summary A sinus infection is swelling of your sinuses. Sinuses are hollow spaces in the bones around your face. This condition is caused by tissues in your nose that become inflamed or swollen. This traps germs. These can lead to infection. If you were prescribed an antibiotic medicine, take it as told by your doctor. Do not stop taking it even if you start to feel better. Keep all follow-up visits. This information is not intended to replace advice given to you by your health care provider. Make sure you discuss any questions you have with your health care provider. Document Revised: 10/11/2021 Document Reviewed: 10/11/2021 Elsevier Patient Education  [place].

## 2022-04-14 NOTE — Progress Notes (Signed)
Sheila Nunez , 19-Jul-1976, 46 y.o., female MRN: [id number] Patient Care Team    Relationship Specialty Notifications Start End  Ma Hillock, DO PCP - General Family Medicine  07/19/15   Tiajuana Amass, MD Referring Physician Allergy and Immunology  05/25/16   [name], DO Consulting Physician Sports Medicine  05/06/18     Chief Complaint  Patient presents with   Nasal Congestion    Pt c/o nasal congestion and drainage, post nasal drip x 3 days     Subjective: Pt presents for an OV with complaints of nasal congestion, drainage and PND of 3 days duration.  Associated symptoms include ear discomfort.  Xyzal, flonase, azelastine compliant.  Pt has tried mucinex Dm to ease their symptoms.      09/23/2021    3:05 PM 08/04/2021   10:00 AM 07/20/2021   10:38 AM 04/01/2021    9:22 AM 06/17/2019    9:11 AM  Depression screen PHQ 2/9  Decreased Interest 0 0 0 0 0  Down, Depressed, Hopeless 0 0 0 0 0  PHQ - 2 Score 0 0 0 0 0    Allergies  Allergen Reactions   Zithromax [Azithromycin] Hives   Social History   Social History Narrative   Single. 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   Never smoker, occ etoh, no drugs.       Past Medical History:  Diagnosis Date   Allergy    Dr. Allyson Sabal   Atopic dermatitis 04/01/2021   Chronic allergic conjunctivitis 04/01/2021   Depression    Eczema    Dr. 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 Gershon Crane   Family History  Problem Relation Age of Onset   GER disease Brother    Cancer Neg Hx    Allergies as of 04/14/2022       Reactions   Zithromax [azithromycin] Hives        Medication List        Accurate as of Apr 14, 2022  2:06 PM. If you have any questions, ask your nurse or  doctor.          Azelastine HCl 137 MCG/SPRAY Soln Place 1 spray into both nostrils 2 (two) times daily as needed.   cholecalciferol 1000 units tablet Commonly known as: VITAMIN D Take 2,000 Units by mouth daily.   doxycycline 100 MG tablet Commonly known as: VIBRA-TABS Take 1 tablet (100 mg total) by mouth 2 (two) times daily. Started by: Howard Pouch, DO   EQ FIBER POWDER PO Take by mouth. Herbal Prima   fluocinonide ointment 0.05 % Commonly known as: LIDEX SMARTSIG:Sparingly Topical Twice Daily PRN   fluticasone 50 MCG/ACT nasal spray Commonly known as: FLONASE 1 spray in each nostril   HAIR/SKIN/NAILS PO Take by mouth.   ipratropium 0.06 % nasal spray Commonly known as: ATROVENT Place 2 sprays into both nostrils 4 (four) times daily. Started by: Howard Pouch, DO   ketotifen 0.025 % ophthalmic solution Commonly known as: ZADITOR 1gtt into each eye   levocetirizine 5 MG tablet Commonly known as: XYZAL Take 5 mg by mouth every evening.   multivitamin capsule Take 1 capsule by mouth daily.  All past medical history, surgical history, allergies, family history, immunizations andmedications were updated in the EMR today and reviewed under the history and medication portions of their EMR.     Review of Systems  Constitutional:  Positive for malaise/fatigue. Negative for chills, fever and weight loss.  HENT:  Positive for congestion, ear pain, sinus pain and sore throat. Negative for ear discharge.   Eyes:  Negative for pain and redness.  Respiratory:  Positive for sputum production. Negative for cough, shortness of breath and wheezing.   Gastrointestinal:  Negative for diarrhea, nausea and vomiting.  Skin:  Negative for rash.  Neurological:  Negative for dizziness and headaches.  Negative, with the exception of above mentioned in HPI   Objective:  BP 107/76   Pulse 74   Temp 98.6 F (37 C) (Oral)   Ht 5' 2"  (1.575 m)   Wt 200 lb (90.7 kg)    SpO2 98%   BMI 36.58 kg/m  Body mass index is 36.58 kg/m. Physical Exam Vitals and nursing note reviewed.  Constitutional:      General: She is not in acute distress.    Appearance: Normal appearance. She is normal weight. She is not ill-appearing or toxic-appearing.  HENT:     Right Ear: Tympanic membrane, ear canal and external ear normal. There is no impacted cerumen.     Left Ear: Tympanic membrane, ear canal and external ear normal. There is no impacted cerumen.     Nose: Congestion and rhinorrhea present.     Mouth/Throat:     Pharynx: No oropharyngeal exudate or posterior oropharyngeal erythema.  Eyes:     General:        Right eye: No discharge.        Left eye: No discharge.     Extraocular Movements: Extraocular movements intact.     Conjunctiva/sclera: Conjunctivae normal.     Pupils: Pupils are equal, round, and reactive to light.  Cardiovascular:     Rate and Rhythm: Normal rate and regular rhythm.  Pulmonary:     Effort: Pulmonary effort is normal.     Breath sounds: Normal breath sounds. No wheezing, rhonchi or rales.  Skin:    Findings: No rash.  Neurological:     Mental Status: She is alert and oriented to person, place, and time. Mental status is at baseline.  Psychiatric:        Mood and Affect: Mood normal.        Behavior: Behavior normal.        Thought Content: Thought content normal.        Judgment: Judgment normal.     No results found. No results found. No results found for this or any previous visit (from the past 24 hour(s)).  Assessment/Plan: Sheila Nunez is a 46 y.o. female present for OV for  Acute non-recurrent maxillary sinusitis Rest, hydrate.  Covid negative Continue Astelin, flonase, mucinex (DM if cough), nettie pot or nasal saline.  Doxy script in hand, if symptoms worsen over holiday weekend she will start.  atrovent nasal spray QID.  She declined depo medrol inj today.  F/U 2 weeks of not improved.    Reviewed  expectations re: course of current medical issues. Discussed self-management of symptoms. Outlined signs and symptoms indicating need for more acute intervention. Patient verbalized understanding and all questions were answered. Patient received an After-Visit Summary.    No orders of the defined types were placed in this encounter.  Meds ordered this encounter  Medications   ipratropium (ATROVENT) 0.06 % nasal spray    Sig: Place 2 sprays into both nostrils 4 (four) times daily.    Dispense:  15 mL    Refill:  2   doxycycline (VIBRA-TABS) 100 MG tablet    Sig: Take 1 tablet (100 mg total) by mouth 2 (two) times daily.    Dispense:  20 tablet    Refill:  0   Referral Orders  No referral(s) requested today     Note is dictated utilizing voice recognition software. Although note has been proof read prior to signing, occasional typographical errors still can be missed. If any questions arise, please do not hesitate to call for verification.   electronically signed by:  Howard Pouch, DO  [place]

## 2022-04-28 ENCOUNTER — Ambulatory Visit
Admission: RE | Admit: 2022-04-28 | Discharge: 2022-04-28 | Disposition: A | Discharge status: Home / Self Care | Payer: 59 | Source: Ambulatory Visit | Attending: Family Medicine | Admitting: Family Medicine

## 2022-04-28 ENCOUNTER — Other Ambulatory Visit: Payer: Self-pay | Admitting: Family Medicine

## 2022-04-28 DIAGNOSIS — R928 Other abnormal and inconclusive findings on diagnostic imaging of breast: Secondary | ICD-10-CM

## 2022-05-01 ENCOUNTER — Encounter: Payer: Self-pay | Admitting: Family Medicine

## 2022-05-12 ENCOUNTER — Ambulatory Visit: Payer: 59 | Admitting: Family Medicine

## 2022-05-12 ENCOUNTER — Encounter: Payer: Self-pay | Admitting: Family Medicine

## 2022-05-12 VITALS — BP 132/88 | HR 65 | Temp 97.9°F | Wt 200.4 lb

## 2022-05-12 DIAGNOSIS — Z30011 Encounter for initial prescription of contraceptive pills: Secondary | ICD-10-CM | POA: Diagnosis not present

## 2022-05-12 DIAGNOSIS — Z3009 Encounter for other general counseling and advice on contraception: Secondary | ICD-10-CM | POA: Diagnosis not present

## 2022-05-12 MED ORDER — NORETHINDRONE ACET-ETHINYL EST 1.5-30 MG-MCG PO TABS: 1.0000 | ORAL_TABLET | Freq: Every day | ORAL | 11 refills | Status: DC

## 2022-05-12 NOTE — Progress Notes (Signed)
Sheila Nunez , 1976/04/26, 46 y.o., female MRN: [id number] Patient Care Team    Relationship Specialty Notifications Start End  Sheila Leatherwood, DO PCP - General Family Medicine  07/19/15   Sheila Stanford, MD Referring Physician Allergy and Immunology  05/25/16   Andrena Mews, DO Consulting Physician Sports Medicine  05/06/18     Chief Complaint  Patient presents with   Contraception    13 yrs since last.discuss      Subjective: Pt presents for an OV discuss birth control options.  She is currently in a monogamous relationship with a female partner.  She does not desire further pregnancies.Patient's last menstrual period was 05/01/2022.  Her menstrual cycles are approximately every 22 days, and last 3 days.  She reports they are not heavy.  She does complain of significant breast tenderness and bloating, the ends up being about 2 weeks out of the month. No fhx of breast or gynecological cancers.  Pt has possible h/o adenomyosis per US> she had increased cramping around menses, but menses was not heavier at that time.  Mammograms are UTD. She did have an abnormal last year, that is felt to be benign mild duct calcifications. She has 6 mos rpt scheduled. She has never smoked. She is currently 45. No personal or fhx of blood clot d/o.      09/23/2021    3:05 PM 08/04/2021   10:00 AM 07/20/2021   10:38 AM 04/01/2021    9:22 AM 06/17/2019    9:11 AM  Depression screen PHQ 2/9  Decreased Interest 0 0 0 0 0  Down, Depressed, Hopeless 0 0 0 0 0  PHQ - 2 Score 0 0 0 0 0    Allergies  Allergen Reactions   Zithromax [Azithromycin] Hives   Nickel Hives, Itching and Rash   Social History   Social History Narrative   Single. 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   Never smoker, occ etoh, no drugs.       Past Medical History:  Diagnosis Date   Allergy    Dr. Terri Piedra   Atopic dermatitis  04/01/2021   Chronic allergic conjunctivitis 04/01/2021   Depression    Eczema    Dr. 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 Nile Riggs   Family History  Problem Relation Age of Onset   GER disease Brother    Cancer Neg Hx    Allergies as of 05/12/2022       Reactions   Zithromax [azithromycin] Hives   Nickel Hives, Itching, Rash        Medication List        Accurate as of May 12, 2022  5:16 PM. If you have any questions, ask your nurse or doctor.          STOP taking these medications    doxycycline 100 MG tablet Commonly known as: VIBRA-TABS Stopped by: Felix Pacini, DO       TAKE these medications    Azelastine HCl 137 MCG/SPRAY Soln Place 1 spray into both nostrils 2 (two) times daily as needed.   cholecalciferol 1000 units tablet Commonly known as: VITAMIN D Take 2,000 Units by mouth daily.  EQ FIBER POWDER PO Take by mouth. Herbal Prima   fluocinonide ointment 0.05 % Commonly known as: LIDEX SMARTSIG:Sparingly Topical Twice Daily PRN   fluticasone 50 MCG/ACT nasal spray Commonly known as: FLONASE 1 spray in each nostril   HAIR/SKIN/NAILS PO Take by mouth.   ipratropium 0.06 % nasal spray Commonly known as: ATROVENT Place 2 sprays into both nostrils 4 (four) times daily.   ketotifen 0.025 % ophthalmic solution Commonly known as: ZADITOR 1gtt into each eye   levocetirizine 5 MG tablet Commonly known as: XYZAL Take 5 mg by mouth every evening.   multivitamin capsule Take 1 capsule by mouth daily.   Norethindrone Acetate-Ethinyl Estradiol 1.5-30 MG-MCG tablet Commonly known as: LOESTRIN Take 1 tablet by mouth daily. Started by: Felix Pacini, DO        All past medical history, surgical history, allergies, family history, immunizations andmedications were updated in the  EMR today and reviewed under the history and medication portions of their EMR.     ROS Negative, with the exception of above mentioned in HPI   Objective:  BP 132/88   Pulse 65   Temp 97.9 F (36.6 C)   Wt 200 lb 6.4 oz (90.9 kg)   LMP 05/01/2022   SpO2 98%   BMI 36.65 kg/m  Body mass index is 36.65 kg/m. Physical Exam Vitals and nursing note reviewed.  Constitutional:      General: She is not in acute distress.    Appearance: Normal appearance. She is normal weight. She is not ill-appearing or toxic-appearing.  Eyes:     Extraocular Movements: Extraocular movements intact.     Conjunctiva/sclera: Conjunctivae normal.     Pupils: Pupils are equal, round, and reactive to light.  Neurological:     Mental Status: She is alert and oriented to person, place, and time. Mental status is at baseline.  Psychiatric:        Mood and Affect: Mood normal.        Behavior: Behavior normal.        Thought Content: Thought content normal.        Judgment: Judgment normal.     No results found. No results found. No results found for this or any previous visit (from the past 24 hour(s)).  Assessment/Plan: [name] is a 46 y.o. female present for OV for  Birth control counseling/Oral contraception initial prescription Lengthy discussion today surrounding safe sex and birth control options. The safest option for her is likely the Mirena IUD, however patient is not in favor of implant. She has never smoked. She is currently 45. Discussed different types of birth control available in detail including NuvaRing, COC, progesterone only, IUD, condoms. She would like to try COC. Loestrin 1.5-30 Called in for her, start pills on the first day of menstrual cycle.  Reviewed expectations re: course of current medical issues. Discussed self-management of symptoms. Outlined signs and symptoms indicating need for more acute intervention. Patient verbalized understanding and all questions  were answered. Patient received an After-Visit Summary.    No orders of the defined types were placed in this encounter.  Meds ordered this encounter  Medications   Norethindrone Acetate-Ethinyl Estradiol (LOESTRIN) 1.5-30 MG-MCG tablet    Sig: Take 1 tablet by mouth daily.    Dispense:  28 tablet    Refill:  11   Referral Orders  No referral(s) requested today     Note is dictated utilizing voice recognition software. Although note has been proof read  prior to signing, occasional typographical errors still can be missed. If any questions arise, please do not hesitate to call for verification.   electronically signed by:  Felix Pacini, DO  [facility] [place]

## 2022-05-16 ENCOUNTER — Encounter: Payer: Self-pay | Admitting: Family Medicine

## 2022-07-21 ENCOUNTER — Encounter: Payer: BC Managed Care – PPO | Admitting: Family Medicine

## 2022-07-27 ENCOUNTER — Ambulatory Visit (INDEPENDENT_AMBULATORY_CARE_PROVIDER_SITE_OTHER): Payer: BC Managed Care – PPO | Admitting: Family Medicine

## 2022-07-27 ENCOUNTER — Encounter: Payer: Self-pay | Admitting: Family Medicine

## 2022-07-27 VITALS — BP 124/84 | HR 80 | Temp 98.5°F | Ht 62.5 in | Wt 199.0 lb

## 2022-07-27 DIAGNOSIS — Z0000 Encounter for general adult medical examination without abnormal findings: Secondary | ICD-10-CM

## 2022-07-27 DIAGNOSIS — E782 Mixed hyperlipidemia: Secondary | ICD-10-CM | POA: Diagnosis not present

## 2022-07-27 DIAGNOSIS — E669 Obesity, unspecified: Secondary | ICD-10-CM | POA: Diagnosis not present

## 2022-07-27 DIAGNOSIS — E559 Vitamin D deficiency, unspecified: Secondary | ICD-10-CM

## 2022-07-27 DIAGNOSIS — Z79899 Other long term (current) drug therapy: Secondary | ICD-10-CM

## 2022-07-27 DIAGNOSIS — Z887 Allergy status to serum and vaccine status: Secondary | ICD-10-CM | POA: Insufficient documentation

## 2022-07-27 DIAGNOSIS — Z23 Encounter for immunization: Secondary | ICD-10-CM

## 2022-07-27 LAB — LIPID PANEL
Cholesterol: 183 mg/dL (ref 0–200)
HDL: 53.6 mg/dL (ref >39.00)
LDL Cholesterol: 114 mg/dL — ABNORMAL HIGH (ref 0–99)
NonHDL: 129.52
Total CHOL/HDL Ratio: 3
Triglycerides: 78 mg/dL (ref 0.0–149.0)
VLDL: 15.6 mg/dL (ref 0.0–40.0)

## 2022-07-27 LAB — CBC
HCT: 42.9 % (ref 36.0–46.0)
Hemoglobin: 14.1 g/dL (ref 12.0–15.0)
MCHC: 32.7 g/dL (ref 30.0–36.0)
MCV: 87.7 fl (ref 78.0–100.0)
Platelets: 200 10*3/uL (ref 150.0–400.0)
RBC: 4.89 Mil/uL (ref 3.87–5.11)
RDW: 12.9 % (ref 11.5–15.5)
WBC: 6.7 10*3/uL (ref 4.0–10.5)

## 2022-07-27 LAB — COMPREHENSIVE METABOLIC PANEL
ALT: 16 U/L (ref 0–35)
AST: 16 U/L (ref 0–37)
Albumin: 3.9 g/dL (ref 3.5–5.2)
Alkaline Phosphatase: 59 U/L (ref 39–117)
BUN: 7 mg/dL (ref 6–23)
CO2: 29 mEq/L (ref 19–32)
Calcium: 9.2 mg/dL (ref 8.4–10.5)
Chloride: 101 mEq/L (ref 96–112)
Creatinine, Ser: 0.8 mg/dL (ref 0.40–1.20)
GFR: 88.79 mL/min (ref >60.00)
Glucose, Bld: 83 mg/dL (ref 70–99)
Potassium: 4.1 mEq/L (ref 3.5–5.1)
Sodium: 138 mEq/L (ref 135–145)
Total Bilirubin: 0.6 mg/dL (ref 0.2–1.2)
Total Protein: 7.4 g/dL (ref 6.0–8.3)

## 2022-07-27 LAB — TSH: TSH: 1.54 u[IU]/mL (ref 0.35–5.50)

## 2022-07-27 LAB — HEMOGLOBIN A1C: Hgb A1c MFr Bld: 5.7 % (ref 4.6–6.5)

## 2022-07-27 LAB — VITAMIN D 25 HYDROXY (VIT D DEFICIENCY, FRACTURES): VITD: 27.73 ng/mL — ABNORMAL LOW (ref 30.00–100.00)

## 2022-07-27 NOTE — Progress Notes (Signed)
Patient ID: Sheila Nunez, female  DOB: [birth year], 46 y.o.   MRN: [id number] Patient Care Team    Relationship Specialty Notifications Start End  Ma Hillock, DO PCP - General Family Medicine  07/19/15   Tiajuana Amass, MD Referring Physician Allergy and Immunology  05/25/16   [name], DO Consulting Physician Sports Medicine  05/06/18     Chief Complaint  Patient presents with   Annual Exam    Pt is fastting    Subjective:  Sheila Nunez is a 46 y.o.  Female  present for CPE  All past medical history, surgical history, allergies, family history, immunizations, medications and social history were updated in the electronic medical record today. All recent labs, ED visits and hospitalizations within the last year were reviewed.  Health maintenance:  Colonoscopy: No Fhx . cologuard 01/11/2022-normal Mammogram: No fhx. Completed 04/28/2022(rpt left)> scheduled for repeat diagnostic 10/18/2022 at breast center. Cervical cancer screening: 07/21/2021-negative with negative HPV-PCP Immunizations: tdap 06/2014, Influenza originally reported allergic, but has had last couple years without incident -declined, covid counseled Infectious disease screening: HIV and hep c  completed  DEXA: routine screen Patient has a Dental home. Hospitalizations/ED visits: reviewed  BCP restarted 04/2022 after many years off birth control.  She is currently in a monogamous relationship with a female partner.  She does not desire further pregnancies.Patient's last menstrual period was Patient's last menstrual period was 07/18/2022. Her menstrual cycles are approximately every 22 days, and last 3 days.  She reports they are not heavy.  She does complain of significant breast tenderness and bloating, the ends up being about 2 weeks out of the month. No fhx of breast or gynecological cancers.  Pt has possible h/o adenomyosis per US> she had increased cramping around menses, but menses was not heavier at that  time.  Mammograms are UTD. She did have an abnormal last year, that is felt to be benign mild duct calcifications. She has 6 mos rpt scheduled. She has never smoked. She is currently 46. No personal or fhx of blood clot d/o.     07/27/2022    9:03 AM 09/23/2021    3:05 PM 08/04/2021   10:00 AM 07/20/2021   10:38 AM 04/01/2021    9:22 AM  Depression screen PHQ 2/9  Decreased Interest 0 0 0 0 0  Down, Depressed, Hopeless 0 0 0 0 0  PHQ - 2 Score 0 0 0 0 0       No data to display          Immunization History  Administered Date(s) Administered   Influenza, High Dose Seasonal PF 01/06/2021   Influenza-Unspecified 09/20/2018   Tdap 07/14/2014   Past Medical History:  Diagnosis Date   Allergy    Dr. Allyson Sabal   Atopic dermatitis 04/01/2021   Chronic allergic conjunctivitis 04/01/2021   Depression    Eczema    Dr. 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 Gershon Crane   Family History  Problem Relation Age of Onset   GER disease Brother    Cancer Neg Hx    Social History   Social History Narrative   Single. 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   Never smoker, occ etoh, no drugs.        Allergies as of 07/27/2022       Reactions   Zithromax [azithromycin] Hives   Nickel Hives, Itching, Rash        Medication List        Accurate as of July 27, 2022  9:30 AM. If you have any questions, ask your nurse or doctor.          STOP taking these medications    ipratropium 0.06 % nasal spray Commonly known as: ATROVENT Stopped by: Howard Pouch, DO       TAKE these medications    Azelastine HCl 137 MCG/SPRAY  Soln Place 1 spray into both nostrils 2 (two) times daily as needed.   cholecalciferol 1000 units tablet Commonly known as: VITAMIN D Take 2,000 Units by mouth daily.   EQ FIBER POWDER PO Take by mouth. Herbal Prima   fluocinonide ointment 0.05 % Commonly known as: LIDEX as needed.   fluticasone 50 MCG/ACT nasal spray Commonly known as: FLONASE as needed.   HAIR/SKIN/NAILS PO Take by mouth.   ketotifen 0.025 % ophthalmic solution Commonly known as: ZADITOR as needed.   levocetirizine 5 MG tablet Commonly known as: XYZAL Take 5 mg by mouth every evening.   Magic Mushroom Mix Caps Take by mouth.   multivitamin capsule Take 1 capsule by mouth daily.   Norethindrone Acetate-Ethinyl Estradiol 1.5-30 MG-MCG tablet Commonly known as: LOESTRIN Take 1 tablet by mouth daily.        All past medical history, surgical history, allergies, family history, immunizations andmedications were updated in the EMR today and reviewed under the history and medication portions of their EMR.     No results found for this or any previous visit (from the past 2160 hour(s)).   ROS 14 pt review of systems performed and negative (unless mentioned in an HPI) Objective: BP 124/84   Pulse 80   Temp 98.5 F (36.9 C) (Oral)   Ht 5' 2.5" (1.588 m)   Wt 199 lb (90.3 kg)   LMP 07/18/2022   SpO2 98%   BMI 35.82 kg/m  Physical Exam Vitals and nursing note reviewed.  Constitutional:      General: She is not in acute distress.    Appearance: Normal appearance. She is not ill-appearing or toxic-appearing.  HENT:     Head: Normocephalic and atraumatic.     Right Ear: Tympanic membrane, ear canal and external ear normal. There is no impacted cerumen.     Left Ear: Tympanic membrane, ear canal and external ear normal. There is no impacted cerumen.     Nose: No congestion or rhinorrhea.     Mouth/Throat:     Mouth: Mucous membranes are moist.     Pharynx: Oropharynx is clear. No  oropharyngeal exudate or posterior oropharyngeal erythema.  Eyes:     General: No scleral icterus.       Right eye: No discharge.        Left eye: No discharge.     Extraocular Movements: Extraocular movements intact.     Conjunctiva/sclera: Conjunctivae normal.     Pupils: Pupils are equal, round, and reactive to light.  Cardiovascular:     Rate and Rhythm: Normal rate and regular rhythm.     Pulses: Normal pulses.     Heart sounds: Normal heart sounds. No murmur  heard.    No friction rub. No gallop.  Pulmonary:     Effort: Pulmonary effort is normal. No respiratory distress.     Breath sounds: Normal breath sounds. No stridor. No wheezing, rhonchi or rales.  Chest:     Chest wall: No tenderness.  Abdominal:     General: Abdomen is flat. Bowel sounds are normal. There is no distension.     Palpations: Abdomen is soft. There is no mass.     Tenderness: There is no abdominal tenderness. There is no right CVA tenderness, left CVA tenderness, guarding or rebound.     Hernia: No hernia is present.  Musculoskeletal:        General: No swelling, tenderness or deformity. Normal range of motion.     Cervical back: Normal range of motion and neck supple. No rigidity or tenderness.     Right lower leg: No edema.     Left lower leg: No edema.  Lymphadenopathy:     Cervical: No cervical adenopathy.  Skin:    General: Skin is warm and dry.     Coloration: Skin is not jaundiced or pale.     Findings: No bruising, erythema, lesion or rash.  Neurological:     General: No focal deficit present.     Mental Status: She is alert and oriented to person, place, and time. Mental status is at baseline.     Cranial Nerves: No cranial nerve deficit.     Sensory: No sensory deficit.     Motor: No weakness.     Coordination: Coordination normal.     Gait: Gait normal.     Deep Tendon Reflexes: Reflexes normal.  Psychiatric:        Mood and Affect: Mood normal.        Behavior: Behavior normal.         Thought Content: Thought content normal.        Judgment: Judgment normal.      No results found.  Assessment/plan: PAYSLEY POPLAR is a 46 y.o. 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  - CBC - Comprehensive metabolic panel - Lipid panel - TSH  Need for immunization against influenza Declined today  Encounter for long-term current use of medication - Hemoglobin A1c Routine general medical examination at a health care facility Colonoscopy: No Fhx . cologuard 01/11/2022-normal Mammogram: No fhx. Completed 04/28/2022(rpt left)> scheduled for repeat diagnostic 10/18/2022 at breast center. Cervical cancer screening: 07/21/2021-negative with negative HPV-PCP Immunizations: tdap 06/2014, Influenza originally reported allergic, but has had last couple years without incident -declined, covid counseled Infectious disease screening: HIV and hep c  completed  DEXA: routine screen Patient was encouraged to exercise greater than 150 minutes a week. Patient was encouraged to choose a diet filled with fresh fruits and vegetables, and lean meats. AVS provided to patient today for education/recommendation on gender specific health and safety maintenance.  Return in about 1 year (around 07/28/2023) for cpe (20 min).   Orders Placed This Encounter  Procedures   VITAMIN D 25 Hydroxy (Vit-D Deficiency, Fractures)   CBC   Comprehensive metabolic panel   Hemoglobin A1c   Lipid panel   TSH   No orders of the defined types were placed in this encounter.  Referral Orders  No referral(s) requested today     Electronically signed by: Howard Pouch, DO [facility] [place]

## 2022-07-27 NOTE — Patient Instructions (Addendum)

## 2022-07-28 ENCOUNTER — Encounter: Payer: Self-pay | Admitting: Family Medicine

## 2022-08-10 ENCOUNTER — Encounter: Payer: Self-pay | Admitting: Family Medicine

## 2022-08-26 IMAGING — MG MM DIGITAL DIAGNOSTIC UNILAT*L* W/ TOMO W/ CAD
6 series · 6 of 14 positions shown · non-contrast
Comparison: Previous exam(s).

CLINICAL DATA: Short-term follow-up for probably benign left breast
calcifications.

EXAM:
DIGITAL DIAGNOSTIC UNILATERAL LEFT MAMMOGRAM WITH TOMOSYNTHESIS AND
CAD
TECHNIQUE: Left digital diagnostic mammography and breast tomosynthesis was
performed. The images were evaluated with computer-aided detection.

[L ML]
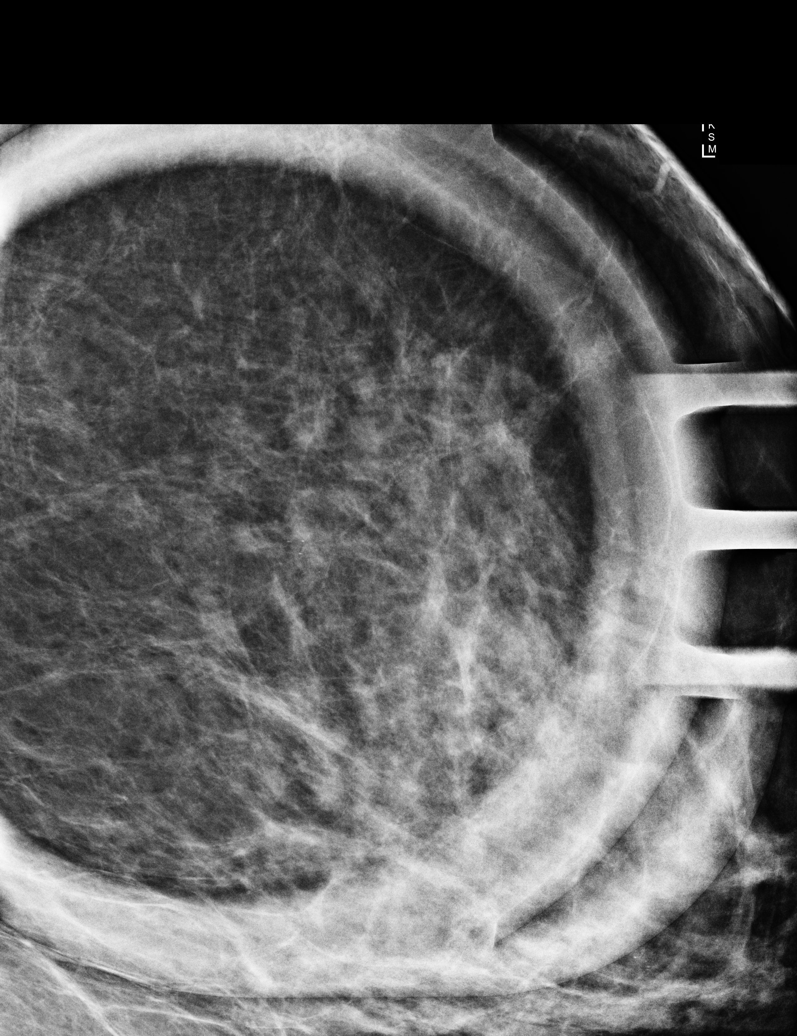

[L CC]
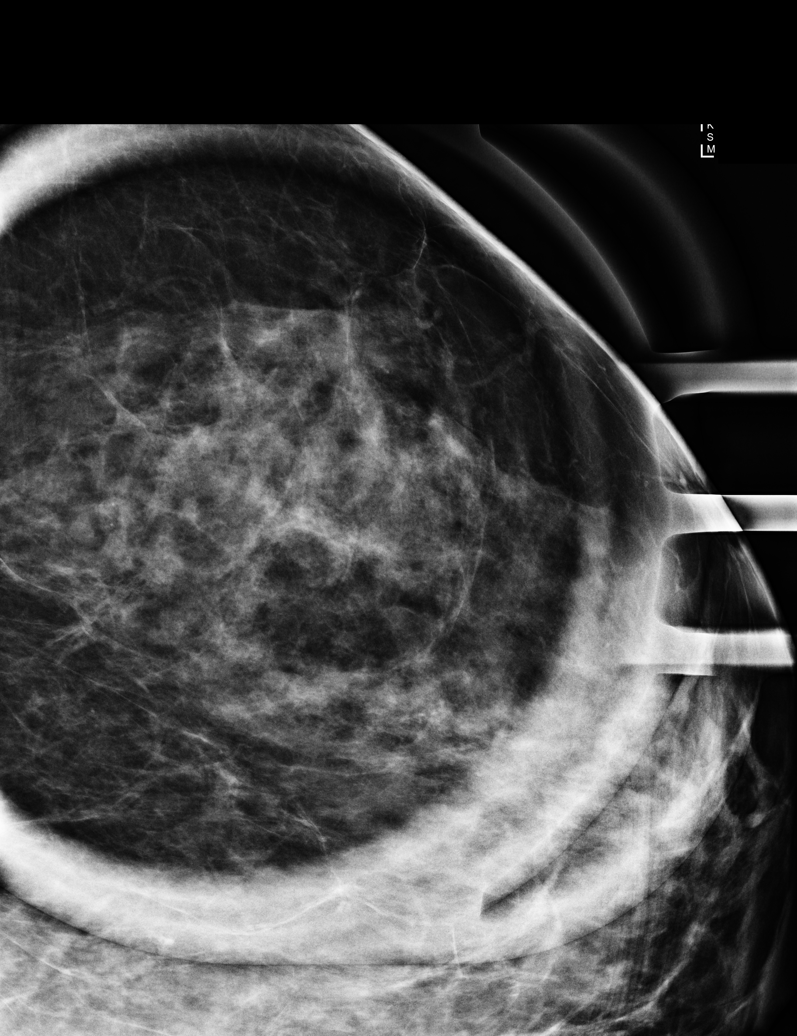

[L MLO synth-2D]
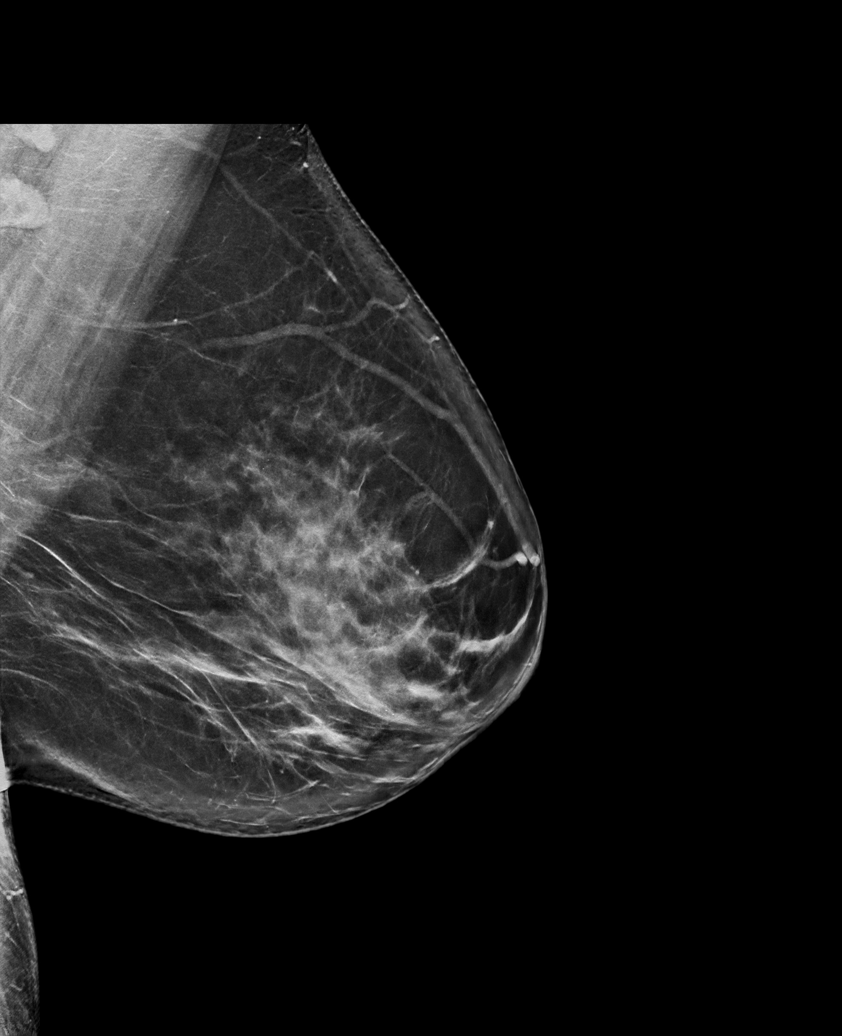

[L CC synth-2D]
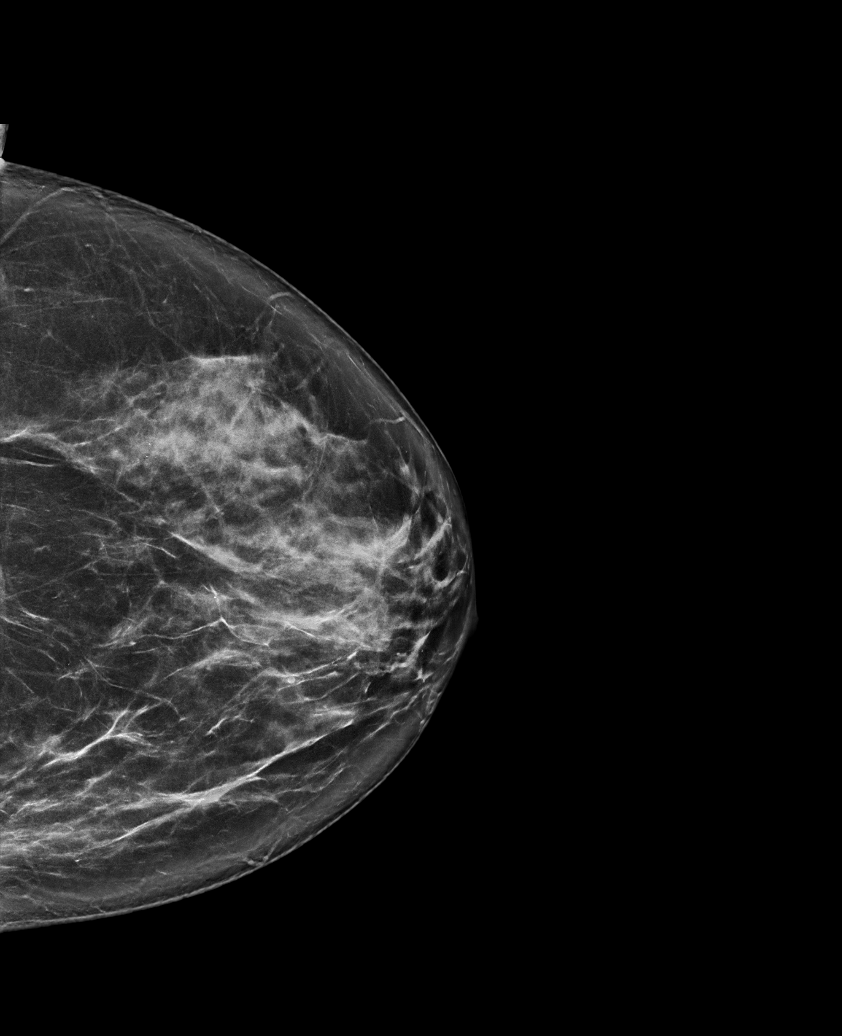

[L CC tomo · tomo slice 42/83.0]
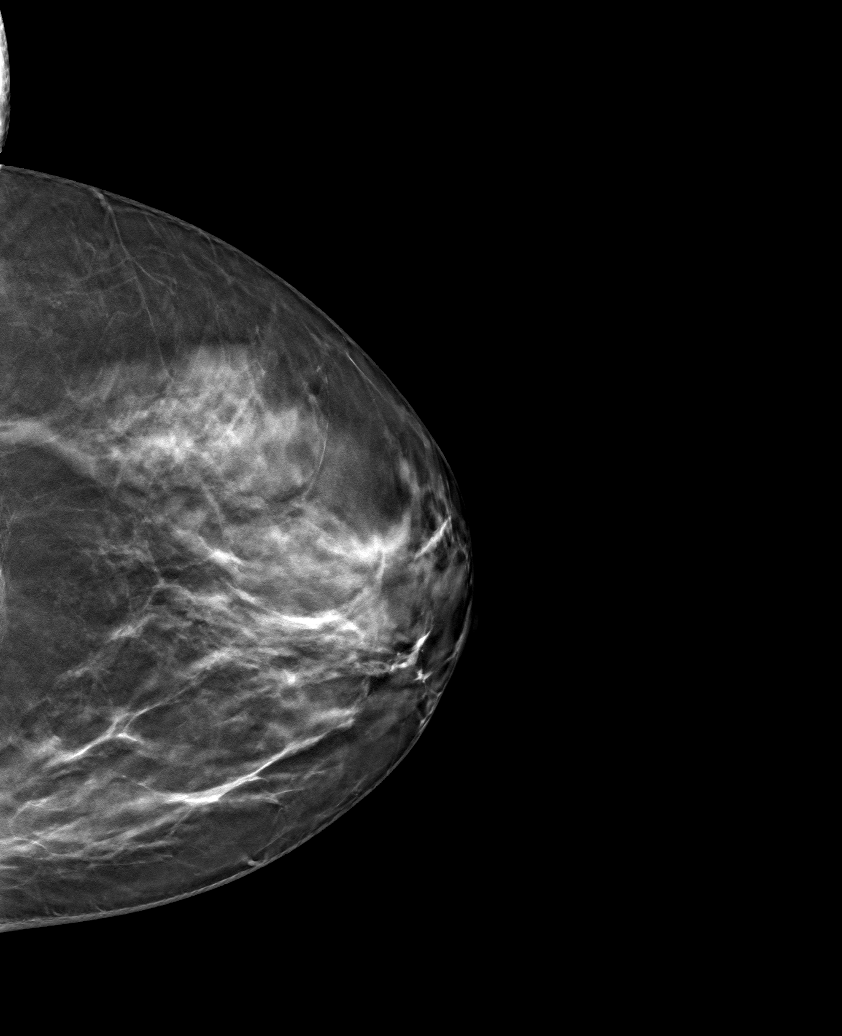

[L MLO tomo · tomo slice 47/92.0]
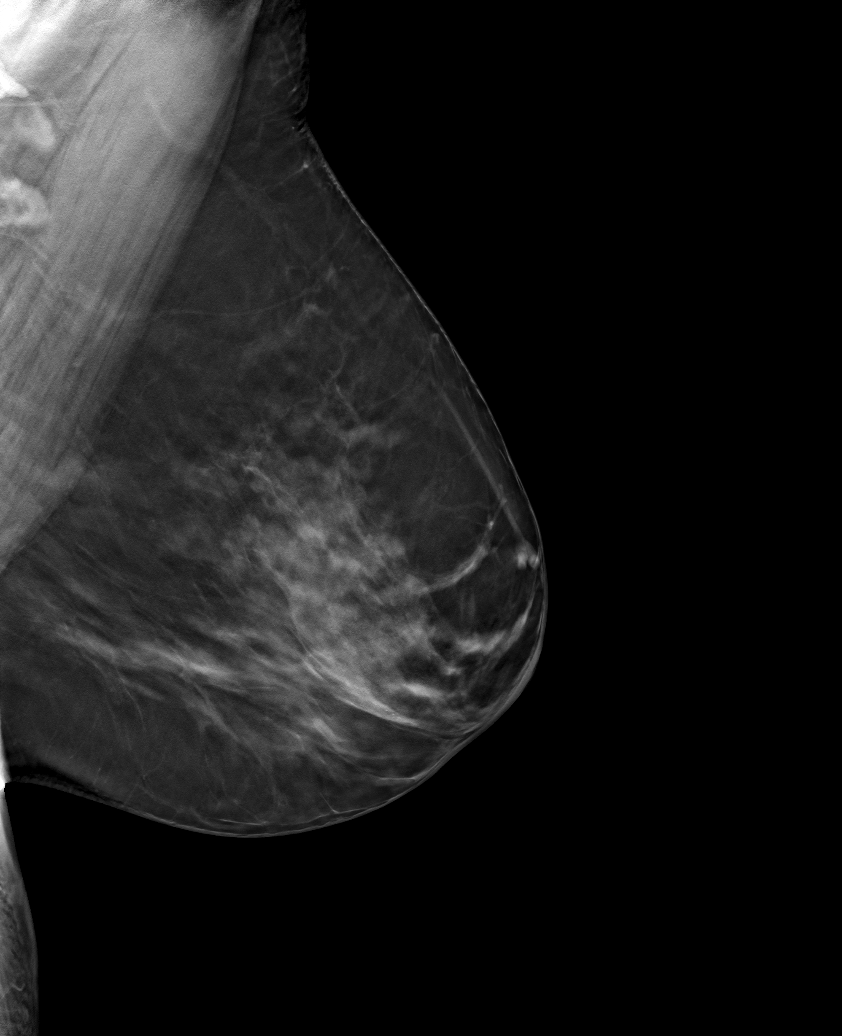

[6 of 14 positions shown; findings below may reference images not displayed]

ACR Breast Density Category c: The breast tissue is heterogeneously
dense, which may obscure small masses.
FINDINGS: No suspicious masses or calcifications are seen in either breast.
Spot compression magnification views were performed over the
upper-outer left breast demonstrating faint round and punctate
calcifications which demonstrate layering on the spot compression
magnification ML view, unchanged and demonstrating features
suggestive of benign milk of calcium. There is no mammographic
evidence of malignancy in the left breast.
IMPRESSION: Stable probably benign left breast calcifications.

RECOMMENDATION:
Recommend bilateral diagnostic mammography with magnification views
of the left breast in 6 months which will demonstrate 1 year of
stability of the probably benign left breast calcifications.

I have discussed the findings and recommendations with the patient.
If applicable, a reminder letter will be sent to the patient
regarding the next appointment.

BI-RADS CATEGORY  3: Probably benign.

## 2022-10-18 ENCOUNTER — Ambulatory Visit
Admission: RE | Admit: 2022-10-18 | Discharge: 2022-10-18 | Disposition: A | Discharge status: Home / Self Care | Payer: BC Managed Care – PPO | Source: Ambulatory Visit | Attending: Family Medicine | Admitting: Family Medicine

## 2022-10-18 DIAGNOSIS — R921 Mammographic calcification found on diagnostic imaging of breast: Secondary | ICD-10-CM | POA: Diagnosis not present

## 2022-10-18 DIAGNOSIS — R928 Other abnormal and inconclusive findings on diagnostic imaging of breast: Secondary | ICD-10-CM

## 2022-11-06 ENCOUNTER — Encounter: Payer: Self-pay | Admitting: Family Medicine

## 2022-11-06 NOTE — Telephone Encounter (Signed)
Called pt and informed her off appt time

## 2022-11-07 ENCOUNTER — Encounter: Payer: Self-pay | Admitting: Family Medicine

## 2022-11-07 ENCOUNTER — Ambulatory Visit: Payer: BC Managed Care – PPO | Admitting: Family Medicine

## 2022-11-07 VITALS — BP 123/79 | HR 104 | Temp 100.6°F | Ht 62.25 in | Wt 196.0 lb

## 2022-11-07 DIAGNOSIS — J029 Acute pharyngitis, unspecified: Secondary | ICD-10-CM | POA: Diagnosis not present

## 2022-11-07 LAB — POCT INFLUENZA A/B
Influenza A, POC: NEGATIVE
Influenza B, POC: NEGATIVE

## 2022-11-07 LAB — POC COVID19 BINAXNOW: SARS Coronavirus 2 Ag: NEGATIVE

## 2022-11-07 LAB — POCT RAPID STREP A (OFFICE): Rapid Strep A Screen: NEGATIVE

## 2022-11-07 MED ORDER — PREDNISONE 20 MG PO TABS: ORAL_TABLET | ORAL | 0 refills | Status: DC

## 2022-11-07 MED ORDER — FLUCONAZOLE 150 MG PO TABS: 150.0000 mg | ORAL_TABLET | Freq: Once | ORAL | 0 refills | Status: AC

## 2022-11-07 MED ORDER — METHYLPREDNISOLONE ACETATE 80 MG/ML IJ SUSP
80.0000 mg | Freq: Once | INTRAMUSCULAR | Status: AC
  Administered 2022-11-07: 80 mg via INTRAMUSCULAR

## 2022-11-07 MED ORDER — AMOXICILLIN-POT CLAVULANATE 875-125 MG PO TABS: 1.0000 | ORAL_TABLET | Freq: Two times a day (BID) | ORAL | 0 refills | Status: AC

## 2022-11-07 NOTE — Patient Instructions (Signed)
No follow-ups on file.        Great to see you today.  I have refilled the medication(s) we provide.   If labs were collected, we will inform you of lab results once received either by echart message or telephone call.   - echart message- for normal results that have been seen by the patient already.   - telephone call: abnormal results or if patient has not viewed results in their echart.  

## 2022-11-07 NOTE — Progress Notes (Signed)
Sheila Nunez , 05-11-1976, 46 y.o., female MRN: [id number] Patient Care Team    Relationship Specialty Notifications Start End  Ma Hillock, DO PCP - General Family Medicine  07/19/15   Tiajuana Amass, MD Referring Physician Allergy and Immunology  05/25/16   [name], DO Consulting Physician Sports Medicine  05/06/18     Chief Complaint  Patient presents with   Hoarse    Pt c/o hoarse, sore throat, R ear pain, HA, sinus pressure, nasal congestion/drainage, chest congestion and post nasal drip x 3 days;      Subjective: Pt presents for an OV with complaints of sore throat, ear pain and sinus pressure of 3d duration. She has loss her voice.  Pt has tried nothing to ease their symptoms.      07/27/2022    9:03 AM 09/23/2021    3:05 PM 08/04/2021   10:00 AM 07/20/2021   10:38 AM 04/01/2021    9:22 AM  Depression screen PHQ 2/9  Decreased Interest 0 0 0 0 0  Down, Depressed, Hopeless 0 0 0 0 0  PHQ - 2 Score 0 0 0 0 0    Allergies  Allergen Reactions   Zithromax [Azithromycin] Hives   Nickel Hives, Itching and Rash   Social History   Social History Narrative   Single. 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   Never smoker, occ etoh, no drugs.       Past Medical History:  Diagnosis Date   Allergy    Dr. Allyson Sabal   Atopic dermatitis 04/01/2021   Chronic allergic conjunctivitis 04/01/2021   Depression    Eczema    Dr. 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 Gershon Crane   Family History  Problem Relation Age of Onset   GER disease Brother    Cancer Neg Hx    Allergies as of 11/07/2022       Reactions   Zithromax [azithromycin] Hives   Nickel Hives, Itching, Rash        Medication List        Accurate as  of November 07, 2022  9:29 AM. If you have any questions, ask your nurse or doctor.          amoxicillin-clavulanate 875-125 MG tablet Commonly known as: AUGMENTIN Take 1 tablet by mouth 2 (two) times daily for 10 days. Started by: Howard Pouch, DO   Azelastine HCl 137 MCG/SPRAY Soln Place 1 spray into both nostrils 2 (two) times daily as needed.   cholecalciferol 1000 units tablet Commonly known as: VITAMIN D Take 2,000 Units by mouth daily.   EQ FIBER POWDER PO Take by mouth. Herbal Prima   fluconazole 150 MG tablet Commonly known as: DIFLUCAN Take 1 tablet (150 mg total) by mouth once for 1 dose. Started by: Howard Pouch, DO   fluocinonide ointment 0.05 % Commonly known as: LIDEX as needed.   fluticasone 50 MCG/ACT nasal spray Commonly known as: FLONASE as needed.   HAIR/SKIN/NAILS PO Take by mouth.   ipratropium 0.06 % nasal spray Commonly known as: ATROVENT Place 2 sprays into both nostrils 4 (four) times daily.   ketotifen 0.025 % ophthalmic solution Commonly known as:  ZADITOR as needed.   levocetirizine 5 MG tablet Commonly known as: XYZAL Take 5 mg by mouth every evening.   Magic Mushroom Mix Caps Take by mouth.   multivitamin capsule Take 1 capsule by mouth daily.   Norethindrone Acetate-Ethinyl Estradiol 1.5-30 MG-MCG tablet Commonly known as: LOESTRIN Take 1 tablet by mouth daily.   predniSONE 20 MG tablet Commonly known as: DELTASONE 60 mg x1d, 40 mg x3d, 20 mg x2d, 10 mg x2d Start taking on: November 08, 2022 Started by: Howard Pouch, DO        All past medical history, surgical history, allergies, family history, immunizations andmedications were updated in the EMR today and reviewed under the history and medication portions of their EMR.     Review of Systems  Constitutional:  Positive for chills, fever and malaise/fatigue.  HENT:  Positive for congestion, ear pain, sinus pain and sore throat.   Eyes:  Negative for pain,  discharge and redness.  Respiratory:  Positive for cough. Negative for sputum production, shortness of breath and wheezing.   Gastrointestinal:  Negative for abdominal pain, constipation, diarrhea, nausea and vomiting.  Musculoskeletal:  Negative for myalgias.  Skin:  Negative for rash.  Neurological:  Positive for headaches. Negative for dizziness.   Negative, with the exception of above mentioned in HPI   Objective:  BP 123/79   Pulse (!) 104   Temp (!) 100.6 F (38.1 C) (Oral)   Ht 5' 2.25" (1.581 m)   Wt 196 lb (88.9 kg)   SpO2 94%   BMI 35.56 kg/m  Body mass index is 35.56 kg/m. Physical Exam Vitals and nursing note reviewed.  Constitutional:      General: She is not in acute distress.    Appearance: Normal appearance. She is not ill-appearing, toxic-appearing or diaphoretic.  HENT:     Head: Normocephalic and atraumatic.     Right Ear: Tympanic membrane and ear canal normal.     Left Ear: Ear canal normal. Tympanic membrane is erythematous.     Nose: Mucosal edema, congestion and rhinorrhea present.     Right Turbinates: Enlarged and swollen.     Left Turbinates: Enlarged and swollen.     Mouth/Throat:     Mouth: Mucous membranes are moist.     Tongue: Lesions present.     Pharynx: Posterior oropharyngeal erythema present. No oropharyngeal exudate.     Tonsils: No tonsillar exudate or tonsillar abscesses. 2+ on the right. 2+ on the left.     Comments: Enlarged tonsils, erythema present. No exudate. Loss of voice.  Eyes:     General: No scleral icterus.       Right eye: No discharge.        Left eye: No discharge.     Extraocular Movements: Extraocular movements intact.     Conjunctiva/sclera: Conjunctivae normal.     Pupils: Pupils are equal, round, and reactive to light.  Cardiovascular:     Rate and Rhythm: Regular rhythm. Tachycardia present.     Heart sounds: No murmur heard. Pulmonary:     Effort: Pulmonary effort is normal. No respiratory distress.      Breath sounds: Normal breath sounds. No wheezing, rhonchi or rales.  Musculoskeletal:     Cervical back: Neck supple. No tenderness.     Right lower leg: No edema.     Left lower leg: No edema.  Lymphadenopathy:     Cervical: No cervical adenopathy.  Skin:    General: Skin is warm and dry.  Coloration: Skin is not jaundiced or pale.     Findings: No erythema or rash.  Neurological:     Mental Status: She is alert and oriented to person, place, and time. Mental status is at baseline.     Motor: No weakness.     Gait: Gait normal.  Psychiatric:        Mood and Affect: Mood normal.        Behavior: Behavior normal.        Thought Content: Thought content normal.        Judgment: Judgment normal.     No results found. No results found. Results for orders placed or performed in visit on 11/07/22 (from the past 24 hour(s))  POC COVID-19 BinaxNow     Status: Normal   Collection Time: 11/07/22  9:28 AM  Result Value Ref Range   SARS Coronavirus 2 Ag Negative Negative  POCT rapid strep A     Status: Normal   Collection Time: 11/07/22  9:28 AM  Result Value Ref Range   Rapid Strep A Screen Negative Negative  POCT Influenza A/B     Status: Normal   Collection Time: 11/07/22  9:28 AM  Result Value Ref Range   Influenza A, POC Negative Negative   Influenza B, POC Negative Negative    Assessment/Plan: Sheila Nunez is a 46 y.o. female present for OV for  Sore throat/ Pharyngitis, unspecified etiology - POC COVID-19 BinaxNow> negative - POCT Influenza A/B> negative - POCT rapid strep A> negative - methylPREDNISolone acetate (DEPO-MEDROL) injection 80 mg today Rest, hydrate.  +/- flonase, mucinex (DM if cough), nettie pot or nasal saline.  Augmentin, prednisone taper prescribed, take until completed.  If cough present it can last up to 6-8 weeks.  F/U 2 weeks of not improved.   Reviewed expectations re: course of current medical issues. Discussed self-management of  symptoms. Outlined signs and symptoms indicating need for more acute intervention. Patient verbalized understanding and all questions were answered. Patient received an After-Visit Summary.    Orders Placed This Encounter  Procedures   POC COVID-19 BinaxNow   POCT Influenza A/B   POCT rapid strep A   Meds ordered this encounter  Medications   amoxicillin-clavulanate (AUGMENTIN) 875-125 MG tablet    Sig: Take 1 tablet by mouth 2 (two) times daily for 10 days.    Dispense:  20 tablet    Refill:  0   fluconazole (DIFLUCAN) 150 MG tablet    Sig: Take 1 tablet (150 mg total) by mouth once for 1 dose.    Dispense:  1 tablet    Refill:  0   predniSONE (DELTASONE) 20 MG tablet    Sig: 60 mg x1d, 40 mg x3d, 20 mg x2d, 10 mg x2d    Dispense:  12 tablet    Refill:  0   methylPREDNISolone acetate (DEPO-MEDROL) injection 80 mg   Referral Orders  No referral(s) requested today     Note is dictated utilizing voice recognition software. Although note has been proof read prior to signing, occasional typographical errors still can be missed. If any questions arise, please do not hesitate to call for verification.   electronically signed by:  Howard Pouch, DO  [facility]

## 2022-11-09 ENCOUNTER — Encounter: Payer: Self-pay | Admitting: Family Medicine

## 2022-11-09 NOTE — Telephone Encounter (Signed)
Called pt and informed her to take Mucinex DM per Dr. Raoul Pitch last note.

## 2022-11-21 ENCOUNTER — Encounter: Payer: Self-pay | Admitting: Family Medicine

## 2022-11-21 NOTE — Telephone Encounter (Signed)
Please advise 

## 2022-12-01 ENCOUNTER — Other Ambulatory Visit: Payer: Self-pay

## 2022-12-07 ENCOUNTER — Encounter: Payer: Self-pay | Admitting: Family Medicine

## 2023-02-07 ENCOUNTER — Telehealth: Payer: Self-pay | Admitting: Family Medicine

## 2023-04-18 ENCOUNTER — Other Ambulatory Visit: Payer: Self-pay

## 2023-04-18 MED ORDER — NORETHINDRONE ACET-ETHINYL EST 1.5-30 MG-MCG PO TABS: 1.0000 | ORAL_TABLET | Freq: Every day | ORAL | 0 refills | Status: DC

## 2023-04-18 NOTE — Telephone Encounter (Signed)
RF request for Norethindrone  LOV: 11/07/22 Next ov: 07/31/23 Last written: 05/17/22 (28,11) [id number] - [place], [place]

## 2023-06-04 ENCOUNTER — Other Ambulatory Visit: Payer: Self-pay | Admitting: Family Medicine

## 2023-06-04 DIAGNOSIS — R921 Mammographic calcification found on diagnostic imaging of breast: Secondary | ICD-10-CM

## 2023-07-05 ENCOUNTER — Encounter (INDEPENDENT_AMBULATORY_CARE_PROVIDER_SITE_OTHER): Payer: Self-pay

## 2023-07-11 ENCOUNTER — Other Ambulatory Visit: Payer: Self-pay | Admitting: Family Medicine

## 2023-07-25 ENCOUNTER — Other Ambulatory Visit: Payer: Self-pay

## 2023-07-31 ENCOUNTER — Encounter: Payer: Self-pay | Admitting: Family Medicine

## 2023-07-31 ENCOUNTER — Ambulatory Visit (INDEPENDENT_AMBULATORY_CARE_PROVIDER_SITE_OTHER): Payer: BC Managed Care – PPO | Admitting: Family Medicine

## 2023-07-31 VITALS — BP 128/84 | HR 61 | Temp 97.8°F | Ht 62.5 in | Wt 203.8 lb

## 2023-07-31 DIAGNOSIS — E559 Vitamin D deficiency, unspecified: Secondary | ICD-10-CM

## 2023-07-31 DIAGNOSIS — Z131 Encounter for screening for diabetes mellitus: Secondary | ICD-10-CM | POA: Diagnosis not present

## 2023-07-31 DIAGNOSIS — L2084 Intrinsic (allergic) eczema: Secondary | ICD-10-CM | POA: Insufficient documentation

## 2023-07-31 DIAGNOSIS — Z0000 Encounter for general adult medical examination without abnormal findings: Secondary | ICD-10-CM

## 2023-07-31 DIAGNOSIS — Z1231 Encounter for screening mammogram for malignant neoplasm of breast: Secondary | ICD-10-CM

## 2023-07-31 DIAGNOSIS — E782 Mixed hyperlipidemia: Secondary | ICD-10-CM

## 2023-07-31 DIAGNOSIS — Z23 Encounter for immunization: Secondary | ICD-10-CM

## 2023-07-31 DIAGNOSIS — E669 Obesity, unspecified: Secondary | ICD-10-CM

## 2023-07-31 LAB — COMPREHENSIVE METABOLIC PANEL
ALT: 12 U/L (ref 0–35)
AST: 13 U/L (ref 0–37)
Albumin: 4.1 g/dL (ref 3.5–5.2)
Alkaline Phosphatase: 60 U/L (ref 39–117)
BUN: 9 mg/dL (ref 6–23)
CO2: 28 meq/L (ref 19–32)
Calcium: 9.4 mg/dL (ref 8.4–10.5)
Chloride: 102 meq/L (ref 96–112)
Creatinine, Ser: 0.79 mg/dL (ref 0.40–1.20)
GFR: 89.5 mL/min (ref >60.00)
Glucose, Bld: 85 mg/dL (ref 70–99)
Potassium: 4.4 meq/L (ref 3.5–5.1)
Sodium: 138 meq/L (ref 135–145)
Total Bilirubin: 0.6 mg/dL (ref 0.2–1.2)
Total Protein: 7.5 g/dL (ref 6.0–8.3)

## 2023-07-31 LAB — LIPID PANEL
Cholesterol: 206 mg/dL — ABNORMAL HIGH (ref 0–200)
HDL: 52.4 mg/dL (ref >39.00)
LDL Cholesterol: 135 mg/dL — ABNORMAL HIGH (ref 0–99)
NonHDL: 153.13
Total CHOL/HDL Ratio: 4
Triglycerides: 89 mg/dL (ref 0.0–149.0)
VLDL: 17.8 mg/dL (ref 0.0–40.0)

## 2023-07-31 LAB — CBC
HCT: 43.6 % (ref 36.0–46.0)
Hemoglobin: 14.5 g/dL (ref 12.0–15.0)
MCHC: 33.3 g/dL (ref 30.0–36.0)
MCV: 89 fl (ref 78.0–100.0)
Platelets: 218 10*3/uL (ref 150.0–400.0)
RBC: 4.9 Mil/uL (ref 3.87–5.11)
RDW: 12.8 % (ref 11.5–15.5)
WBC: 6.4 10*3/uL (ref 4.0–10.5)

## 2023-07-31 LAB — TSH: TSH: 1.25 u[IU]/mL (ref 0.35–5.50)

## 2023-07-31 LAB — HEMOGLOBIN A1C: Hgb A1c MFr Bld: 5.6 % (ref 4.6–6.5)

## 2023-07-31 MED ORDER — NORETHINDRONE ACET-ETHINYL EST 1.5-30 MG-MCG PO TABS: 1.0000 | ORAL_TABLET | Freq: Every day | ORAL | 3 refills | Status: DC

## 2023-07-31 MED ORDER — FLUOCINONIDE 0.05 % EX OINT: TOPICAL_OINTMENT | CUTANEOUS | 11 refills | Status: DC | PRN

## 2023-07-31 NOTE — Patient Instructions (Addendum)
Return in about 1 year (around 07/31/2024) for cpe (20 min), Routine chronic condition follow-up.        Great to see you today.  I have refilled the medication(s) we provide.   If labs were collected or images ordered, we will inform you of  results once we have received them and reviewed. We will contact you either by echart message, or telephone call.  Please give ample time to the testing facility, and our office to run,  receive and review results. Please do not call inquiring of results, even if you can see them in your chart. We will contact you as soon as we are able. If it has been over 1 week since the test was completed, and you have not yet heard from [place], then please call us.    - echart message- for normal results that have been seen by the patient already.   - telephone call: abnormal results or if patient has not viewed results in their echart.  If a referral to a specialist was entered for you, please call us in 2 weeks if you have not heard from the specialist office to schedule.

## 2023-07-31 NOTE — Progress Notes (Signed)
Patient ID: Sheila Nunez, female  DOB: March 02, 1976, 47 y.o.   MRN: [id number] Patient Care Team    Relationship Specialty Notifications Start End  Natalia Leatherwood, DO PCP - General Family Medicine  07/19/15   Eileen Stanford, MD Referring Physician Allergy and Immunology  05/25/16   Andrena Mews, DO Consulting Physician Sports Medicine  05/06/18     Chief Complaint  Patient presents with   Annual Exam    Subjective:  Sheila Nunez is a 47 y.o.  Female  present for CPE and Chronic Conditions/illness Management All past medical history, surgical history, allergies, family history, immunizations, medications and social history were updated in the electronic medical record today. All recent labs, ED visits and hospitalizations within the last year were reviewed.  Health maintenance:  Colonoscopy: No Fhx . cologuard 01/11/2022-normal Mammogram: No fhx.  Scheduled for 10/24/2023 Cervical cancer screening: 07/21/2021-negative with negative HPV-PCP Immunizations: tdap 06/2014, Influenza originally reported allergic, but has had last couple years without incident -declined, covid counseled Infectious disease screening: HIV and hep c  completed  DEXA: routine screen Patient has a Dental home. Hospitalizations/ED visits: reviewed  BCP restarted 04/2022 after many years off birth control.  She is currently in a monogamous relationship with a female partner.  She does not desire further pregnancies.Patient's last menstrual period was Patient's last menstrual period was 06/26/2023 (approximate). Her menstrual cycles are approximately every 22 days, and last 3 days.  She reports they are not heavy. Pt has possible h/o adenomyosis per US> she had increased cramping around menses, but menses was not heavier at that time.  Mammograms are UTD. She did have an abnormal last year, that is felt to be benign mild duct calcifications.  She is current with mammograms She has never smoked. She is currently  45. No personal or fhx of blood clot d/o.     07/31/2023    9:07 AM 07/27/2022    9:03 AM 09/23/2021    3:05 PM 08/04/2021   10:00 AM 07/20/2021   10:38 AM  Depression screen PHQ 2/9  Decreased Interest 0 0 0 0 0  Down, Depressed, Hopeless 0 0 0 0 0  PHQ - 2 Score 0 0 0 0 0       No data to display          Immunization History  Administered Date(s) Administered   Influenza, High Dose Seasonal PF 01/06/2021   Influenza-Unspecified 09/20/2018   Tdap 07/14/2014   Past Medical History:  Diagnosis Date   Allergy    Dr. Terri Piedra   Atopic dermatitis 04/01/2021   Chronic allergic conjunctivitis 04/01/2021   Depression    Eczema    Dr. 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 Nile Riggs   Family History  Problem Relation Age of Onset   GER disease Brother    Cancer Neg Hx    Social History   Social History Narrative   Single. 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   Never smoker, occ etoh, no drugs.        Allergies as of 07/31/2023  Reactions   Zithromax [azithromycin] Hives   Nickel Hives, Itching, Rash        Medication List        Accurate as of July 31, 2023  9:17 AM. If you have any questions, ask your nurse or doctor.          Azelastine HCl 137 MCG/SPRAY Soln Place 1 spray into both nostrils 2 (two) times daily as needed.   cholecalciferol 1000 units tablet Commonly known as: VITAMIN D Take 2,000 Units by mouth daily.   EQ FIBER POWDER PO Take by mouth. Herbal Prima   fluocinonide ointment 0.05 % Commonly known as: LIDEX as needed.   fluticasone 50 MCG/ACT nasal spray Commonly known as:  FLONASE as needed.   HAIR/SKIN/NAILS PO Take by mouth.   ipratropium 0.06 % nasal spray Commonly known as: ATROVENT Place 2 sprays into both nostrils 4 (four) times daily.   ketotifen 0.025 % ophthalmic solution Commonly known as: ZADITOR as needed.   levocetirizine 5 MG tablet Commonly known as: XYZAL Take 5 mg by mouth every evening.   Magic Mushroom Mix Caps Take by mouth.   multivitamin capsule Take 1 capsule by mouth daily.   Norethindrone Acetate-Ethinyl Estradiol 1.5-30 MG-MCG tablet Commonly known as: LOESTRIN TAKE 1 TABLET DAILY        All past medical history, surgical history, allergies, family history, immunizations andmedications were updated in the EMR today and reviewed under the history and medication portions of their EMR.     No results found for this or any previous visit (from the past 2160 hour(s)).   ROS 14 pt review of systems performed and negative (unless mentioned in an HPI) Objective: BP 128/84   Pulse 61   Temp 97.8 F (36.6 C)   Ht 5' 2.5" (1.588 m)   Wt 203 lb 12.8 oz (92.4 kg)   LMP 06/26/2023 (Approximate)   SpO2 99%   BMI 36.68 kg/m  Physical Exam Vitals and nursing note reviewed.  Constitutional:      General: She is not in acute distress.    Appearance: Normal appearance. She is not ill-appearing or toxic-appearing.  HENT:     Head: Normocephalic and atraumatic.     Right Ear: Tympanic membrane, ear canal and external ear normal. There is no impacted cerumen.     Left Ear: Tympanic membrane, ear canal and external ear normal. There is no impacted cerumen.     Nose: No congestion or rhinorrhea.     Mouth/Throat:     Mouth: Mucous membranes are moist.     Pharynx: Oropharynx is clear. No oropharyngeal exudate or posterior oropharyngeal erythema.  Eyes:     General: No scleral icterus.       Right eye: No discharge.        Left eye: No discharge.     Extraocular Movements: Extraocular movements intact.      Conjunctiva/sclera: Conjunctivae normal.     Pupils: Pupils are equal, round, and reactive to light.  Cardiovascular:     Rate and Rhythm: Normal rate and regular rhythm.     Pulses: Normal pulses.     Heart sounds: Normal heart sounds. No murmur heard.    No friction rub. No gallop.  Pulmonary:     Effort: Pulmonary effort is normal. No respiratory distress.     Breath sounds: Normal breath sounds. No stridor. No wheezing, rhonchi or rales.  Chest:     Chest wall: No tenderness.  Abdominal:  General: Abdomen is flat. Bowel sounds are normal. There is no distension.     Palpations: Abdomen is soft. There is no mass.     Tenderness: There is no abdominal tenderness. There is no right CVA tenderness, left CVA tenderness, guarding or rebound.     Hernia: No hernia is present.  Musculoskeletal:        General: No swelling, tenderness or deformity. Normal range of motion.     Cervical back: Normal range of motion and neck supple. No rigidity or tenderness.     Right lower leg: No edema.     Left lower leg: No edema.  Lymphadenopathy:     Cervical: No cervical adenopathy.  Skin:    General: Skin is warm and dry.     Coloration: Skin is not jaundiced or pale.     Findings: No bruising, erythema, lesion or rash.  Neurological:     General: No focal deficit present.     Mental Status: She is alert and oriented to person, place, and time. Mental status is at baseline.     Cranial Nerves: No cranial nerve deficit.     Sensory: No sensory deficit.     Motor: No weakness.     Coordination: Coordination normal.     Gait: Gait normal.     Deep Tendon Reflexes: Reflexes normal.  Psychiatric:        Mood and Affect: Mood normal.        Behavior: Behavior normal.        Thought Content: Thought content normal.        Judgment: Judgment normal.      No results found.  Assessment/plan: ARLESIA ZACCARIA is a 47 y.o. 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  Lipid panel collected today Breast cancer screening by mammogram Scheduled 10/24/2023  Influenza vaccine declined   Diabetes mellitus screening - Hemoglobin A1c Obesity (BMI 30-39.9) - Hemoglobin A1c Intrinsic eczema Lidex refilled for her has needed  Routine general medical examination at a health care facility Patient was encouraged to exercise greater than 150 minutes a week. Patient was encouraged to choose a diet filled with fresh fruits and vegetables, and lean meats. AVS provided to patient today for education/recommendation on gender specific health and safety maintenance. - Comprehensive metabolic panel - TSH - CBC Colonoscopy: No Fhx . cologuard 01/11/2022-normal Mammogram: No fhx.  Scheduled for 10/24/2023 Cervical cancer screening: 07/21/2021-negative with negative HPV-PCP Immunizations: tdap 06/2014, Influenza originally reported allergic, but has had last couple years without incident -declined, covid counseled Infectious disease screening: HIV and hep c  completed  DEXA: routine screen  Return in about 1 year (around 07/31/2024) for cpe (20 min), Routine chronic condition follow-up.   Orders Placed This Encounter  Procedures   Comprehensive metabolic panel   Hemoglobin A1c   TSH   Lipid panel   CBC   No orders of the defined types were placed in this encounter.  Referral Orders  No referral(s) requested today     Electronically signed by: Felix Pacini, DO [facility] [place]

## 2023-08-28 ENCOUNTER — Encounter: Payer: Self-pay | Admitting: Family Medicine

## 2023-10-08 ENCOUNTER — Encounter: Payer: Self-pay | Admitting: Family Medicine

## 2023-10-08 ENCOUNTER — Ambulatory Visit: Payer: BC Managed Care – PPO | Admitting: Family Medicine

## 2023-10-08 VITALS — BP 110/80 | HR 78 | Temp 99.0°F | Wt 201.0 lb

## 2023-10-08 DIAGNOSIS — J029 Acute pharyngitis, unspecified: Secondary | ICD-10-CM

## 2023-10-08 DIAGNOSIS — B9689 Other specified bacterial agents as the cause of diseases classified elsewhere: Secondary | ICD-10-CM | POA: Diagnosis not present

## 2023-10-08 DIAGNOSIS — J329 Chronic sinusitis, unspecified: Secondary | ICD-10-CM

## 2023-10-08 LAB — POCT INFLUENZA A/B
Influenza A, POC: NEGATIVE
Influenza B, POC: NEGATIVE

## 2023-10-08 LAB — POC COVID19 BINAXNOW: SARS Coronavirus 2 Ag: NEGATIVE

## 2023-10-08 LAB — POCT RAPID STREP A (OFFICE): Rapid Strep A Screen: NEGATIVE

## 2023-10-08 MED ORDER — AMOXICILLIN-POT CLAVULANATE 875-125 MG PO TABS: 1.0000 | ORAL_TABLET | Freq: Two times a day (BID) | ORAL | 0 refills | Status: DC

## 2023-10-08 MED ORDER — METHYLPREDNISOLONE ACETATE 80 MG/ML IJ SUSP
80.0000 mg | Freq: Once | INTRAMUSCULAR | Status: AC
Start: 2023-10-08 — End: 2023-10-08
  Administered 2023-10-08: 80 mg via INTRAMUSCULAR

## 2023-10-08 NOTE — Addendum Note (Signed)
Addended by: Filomena Jungling on: 10/08/2023 03:01 PM   Modules accepted: Orders

## 2023-10-08 NOTE — Progress Notes (Signed)
Sheila Nunez , 24-Feb-1976, 47 y.o., female MRN: [id number] Patient Care Team    Relationship Specialty Notifications Start End  Natalia Leatherwood, DO PCP - General Family Medicine  07/19/15   Eileen Stanford, MD Referring Physician Allergy and Immunology  05/25/16   Andrena Mews, DO Consulting Physician Sports Medicine  05/06/18     Chief Complaint  Patient presents with   Sore Throat    Ear pain and HA for 1 week; chills 1 day     Subjective: Sheila Nunez is a 47 y.o. Pt presents for an OV with complaints of sore throat and earache of 1 week duration.  Associated symptoms include chills.  She reports she has lost her voice.  She feels like the inside of her throat swollen.  She endorses fatigue.  Pt has tried xyzal and Flonase to ease their symptoms.      07/31/2023    9:07 AM 07/27/2022    9:03 AM 09/23/2021    3:05 PM 08/04/2021   10:00 AM 07/20/2021   10:38 AM  Depression screen PHQ 2/9  Decreased Interest 0 0 0 0 0  Down, Depressed, Hopeless 0 0 0 0 0  PHQ - 2 Score 0 0 0 0 0    Allergies  Allergen Reactions   Zithromax [Azithromycin] Hives   Nickel Hives, Itching and Rash   Social History   Social History Narrative   Single. 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   Never smoker, occ etoh, no drugs.       Past Medical History:  Diagnosis Date   Allergy    Dr. Terri Piedra   Atopic dermatitis 04/01/2021   Chronic allergic conjunctivitis 04/01/2021   Depression    Eczema    Dr. 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 Nile Riggs   Family History  Problem Relation Age of Onset   GER disease Brother    Cancer Neg Hx    Allergies as of 10/08/2023       Reactions   Zithromax [azithromycin] Hives   Nickel Hives,  Itching, Rash        Medication List        Accurate as of October 08, 2023  2:08 PM. If you have any questions, ask your nurse or doctor.          amoxicillin-clavulanate 875-125 MG tablet Commonly known as: AUGMENTIN Take 1 tablet by mouth 2 (two) times daily. Started by: Felix Pacini   Azelastine HCl 137 MCG/SPRAY Soln Place 1 spray into both nostrils 2 (two) times daily as needed.   cholecalciferol 1000 units tablet Commonly known as: VITAMIN D Take 2,000 Units by mouth daily.   EQ FIBER POWDER PO Take by mouth. Herbal Prima   fluocinonide ointment 0.05 % Commonly known as: LIDEX Apply topically as needed.   fluticasone 50 MCG/ACT nasal spray Commonly known as: FLONASE as needed.   HAIR/SKIN/NAILS PO Take by mouth.   ipratropium 0.06 % nasal spray Commonly known as: ATROVENT Place 2 sprays into both nostrils 4 (four) times daily.   ketotifen 0.025 % ophthalmic solution Commonly known as: ZADITOR as needed.   levocetirizine 5 MG tablet Commonly known as:  XYZAL Take 5 mg by mouth every evening.   Magic Mushroom Mix Caps Take by mouth.   multivitamin capsule Take 1 capsule by mouth daily.   Norethindrone Acetate-Ethinyl Estradiol 1.5-30 MG-MCG tablet Commonly known as: LOESTRIN Take 1 tablet by mouth daily.        All past medical history, surgical history, allergies, family history, immunizations andmedications were updated in the EMR today and reviewed under the history and medication portions of their EMR.     Review of Systems  Constitutional:  Positive for chills and malaise/fatigue. Negative for fever.  HENT:  Positive for ear pain and sore throat. Negative for congestion and sinus pain.   Eyes:  Negative for pain and discharge.  Respiratory:  Negative for cough, sputum production, shortness of breath and wheezing.   Gastrointestinal:  Negative for abdominal pain, diarrhea, nausea and vomiting.  Musculoskeletal:  Negative for  myalgias.  Skin:  Negative for rash.  Neurological:  Positive for headaches. Negative for dizziness.   Negative, with the exception of above mentioned in HPI   Objective:  BP 110/80   Pulse 78   Temp 99 F (37.2 C)   Wt 201 lb (91.2 kg)   SpO2 98%   BMI 36.18 kg/m  Body mass index is 36.18 kg/m. Physical Exam Vitals and nursing note reviewed.  Constitutional:      General: She is not in acute distress.    Appearance: Normal appearance. She is normal weight. She is not ill-appearing or toxic-appearing.  HENT:     Head: Normocephalic and atraumatic.     Right Ear: Tympanic membrane and ear canal normal. No drainage.     Left Ear: Tympanic membrane and ear canal normal. No drainage.     Ears:     Comments: Postnasal drip present.    Mouth/Throat:     Mouth: Mucous membranes are moist. No oral lesions.     Pharynx: Pharyngeal swelling and posterior oropharyngeal erythema present. No oropharyngeal exudate.  Eyes:     General: No scleral icterus.       Right eye: No discharge.        Left eye: No discharge.     Extraocular Movements: Extraocular movements intact.     Conjunctiva/sclera: Conjunctivae normal.     Pupils: Pupils are equal, round, and reactive to light.  Cardiovascular:     Rate and Rhythm: Normal rate and regular rhythm.     Heart sounds: No murmur heard. Pulmonary:     Effort: Pulmonary effort is normal. No respiratory distress.     Breath sounds: Normal breath sounds. No wheezing, rhonchi or rales.  Musculoskeletal:     Cervical back: Neck supple.     Right lower leg: No edema.     Left lower leg: No edema.  Lymphadenopathy:     Cervical: Cervical adenopathy present.  Skin:    Findings: No rash.  Neurological:     Mental Status: She is alert and oriented to person, place, and time. Mental status is at baseline.     Motor: No weakness.     Coordination: Coordination normal.     Gait: Gait normal.  Psychiatric:        Mood and Affect: Mood normal.         Behavior: Behavior normal.        Thought Content: Thought content normal.        Judgment: Judgment normal.    No results found. No results found. Results for orders placed  or performed in visit on 10/08/23 (from the past 24 hour(s))  POC COVID-19 BinaxNow     Status: None   Collection Time: 10/08/23  2:03 PM  Result Value Ref Range   SARS Coronavirus 2 Ag Negative Negative  POCT Influenza A/B     Status: None   Collection Time: 10/08/23  2:03 PM  Result Value Ref Range   Influenza A, POC Negative Negative   Influenza B, POC Negative Negative  POCT rapid strep A     Status: None   Collection Time: 10/08/23  2:03 PM  Result Value Ref Range   Rapid Strep A Screen Negative Negative    Assessment/Plan: ALIANNA HYPPOLITE is a 47 y.o. female present for OV for  Bacterial sinusitis Likely started with viral syndrome, has progressed to bacterial infection today.  She does have rather significant swelling of her tonsils. Point-of-care testing is negative for influenza, COVID and rapid strep Low grade temp today Rest, hydrate.  Start OTC mucinex (DM if cough), nettie pot or nasal saline.  Augmentin twice daily prescribed, take until completed.  Continue Xyzal and Flonase IM Depo-Medrol 80 provided today.  Hopefully this helps with the swelling/pharyngitis aspect of her symptoms. F/U 2 weeks if not improved.   Reviewed expectations re: course of current medical issues. Discussed self-management of symptoms. Outlined signs and symptoms indicating need for more acute intervention. Patient verbalized understanding and all questions were answered. Patient received an After-Visit Summary.    Orders Placed This Encounter  Procedures   POC COVID-19 BinaxNow   POCT Influenza A/B   POCT rapid strep A   Meds ordered this encounter  Medications   amoxicillin-clavulanate (AUGMENTIN) 875-125 MG tablet    Sig: Take 1 tablet by mouth 2 (two) times daily.    Dispense:  20 tablet     Refill:  0   Referral Orders  No referral(s) requested today     Note is dictated utilizing voice recognition software. Although note has been proof read prior to signing, occasional typographical errors still can be missed. If any questions arise, please do not hesitate to call for verification.   electronically signed by:  Felix Pacini, DO  [facility] [place]

## 2023-10-08 NOTE — Patient Instructions (Addendum)
Return in about 2 weeks (around 10/22/2023), or if symptoms worsen or fail to improve.  Flonase nasal spray 2x daily Augmentin prescribed.         Great to see you today.  I have refilled the medication(s) we provide.   If labs were collected or images ordered, we will inform you of  results once we have received them and reviewed. We will contact you either by echart message, or telephone call.  Please give ample time to the testing facility, and our office to run,  receive and review results. Please do not call inquiring of results, even if you can see them in your chart. We will contact you as soon as we are able. If it has been over 1 week since the test was completed, and you have not yet heard from [place], then please call us.    - echart message- for normal results that have been seen by the patient already.   - telephone call: abnormal results or if patient has not viewed results in their echart.  If a referral to a specialist was entered for you, please call us in 2 weeks if you have not heard from the specialist office to schedule.

## 2023-10-16 ENCOUNTER — Other Ambulatory Visit: Payer: Self-pay | Admitting: Family Medicine

## 2023-10-16 DIAGNOSIS — R921 Mammographic calcification found on diagnostic imaging of breast: Secondary | ICD-10-CM

## 2023-10-24 ENCOUNTER — Ambulatory Visit
Admission: RE | Admit: 2023-10-24 | Discharge: 2023-10-24 | Disposition: A | Discharge status: Home / Self Care | Payer: BC Managed Care – PPO | Source: Ambulatory Visit | Attending: Family Medicine | Admitting: Family Medicine

## 2023-10-24 DIAGNOSIS — R921 Mammographic calcification found on diagnostic imaging of breast: Secondary | ICD-10-CM | POA: Diagnosis not present

## 2023-11-30 ENCOUNTER — Telehealth: Payer: BC Managed Care – PPO | Admitting: Family Medicine

## 2023-11-30 ENCOUNTER — Encounter: Payer: Self-pay | Admitting: Family Medicine

## 2023-11-30 VITALS — Wt 192.0 lb

## 2023-11-30 DIAGNOSIS — J329 Chronic sinusitis, unspecified: Secondary | ICD-10-CM | POA: Diagnosis not present

## 2023-11-30 DIAGNOSIS — J040 Acute laryngitis: Secondary | ICD-10-CM

## 2023-11-30 DIAGNOSIS — B9689 Other specified bacterial agents as the cause of diseases classified elsewhere: Secondary | ICD-10-CM

## 2023-11-30 MED ORDER — DOXYCYCLINE HYCLATE 100 MG PO TABS: 100.0000 mg | ORAL_TABLET | Freq: Two times a day (BID) | ORAL | 0 refills | Status: DC

## 2023-11-30 NOTE — Progress Notes (Signed)
 VIRTUAL VISIT VIA VIDEO  I connected with Sheila Nunez on 11/30/23 at  2:00 PM EST by a video enabled telemedicine application and verified that I am speaking with the correct person using two identifiers. Location patient: Home Location provider: [facility], Office Persons participating in the virtual visit: Patient, Dr. Catherine and ALONSO Sharps, CMA  I discussed the limitations of evaluation and management by telemedicine and the availability of in person appointments. The patient expressed understanding and agreed to proceed.    Sheila Nunez , [birth year], 48 y.o., female MRN: [id number] Patient Care Team    Relationship Specialty Notifications Start End  Catherine Charlies LABOR, DO PCP - General Family Medicine  07/19/15   Cheryn Nickels, MD Referring Physician Allergy  and Immunology  05/25/16   Marquette Ozell BIRCH, DO Consulting Physician Sports Medicine  05/06/18     Chief Complaint  Patient presents with   Hoarse    Since monday     Subjective: Sheila Nunez is a 48 y.o. Pt presents for an OV with complaints of laryngitis symptoms started 5 days ago.  She reports she had a sore throat on Wednesday, but that resolved within 24 hours.  She endorses frontal sinus pressure, nasal congestion and postnasal drip.  She reports the mucus in her nose is discolored at times.  She has been using Mucinex D and Flonase .      07/31/2023    9:07 AM 07/27/2022    9:03 AM 09/23/2021    3:05 PM 08/04/2021   10:00 AM 07/20/2021   10:38 AM  Depression screen PHQ 2/9  Decreased Interest 0 0 0 0 0  Down, Depressed, Hopeless 0 0 0 0 0  PHQ - 2 Score 0 0 0 0 0    Allergies  Allergen Reactions   Zithromax [Azithromycin] Hives   Nickel Hives, Itching and Rash   Social History   Social History Narrative   Single. 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   Never smoker, occ etoh, no drugs.       Past Medical  History:  Diagnosis Date   Allergy     Dr. Ivin   Atopic dermatitis 04/01/2021   Chronic allergic conjunctivitis 04/01/2021   Depression    Eczema    Dr. 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 Roz   Family History  Problem Relation Age of Onset   GER disease Brother    Cancer Neg Hx    Allergies as of 11/30/2023       Reactions   Zithromax [azithromycin] Hives   Nickel Hives, Itching, Rash        Medication List        Accurate as of November 30, 2023 11:37 AM. If you have any questions, ask your nurse or doctor.          amoxicillin -clavulanate 875-125 MG tablet Commonly known as: AUGMENTIN  Take 1 tablet by mouth 2 (two) times daily.   Azelastine HCl 137 MCG/SPRAY Soln Place 1 spray into both nostrils 2 (two) times daily as needed.   cholecalciferol 1000 units tablet Commonly known as: VITAMIN D  Take 2,000 Units by mouth daily.   doxycycline  100 MG tablet Commonly  known as: VIBRA -TABS Take 1 tablet (100 mg total) by mouth 2 (two) times daily. Started by: Shepard Keltz   EQ FIBER POWDER PO Take by mouth. Herbal Prima   fluocinonide  ointment 0.05 % Commonly known as: LIDEX  Apply topically as needed.   fluticasone  50 MCG/ACT nasal spray Commonly known as: FLONASE  as needed.   HAIR/SKIN/NAILS PO Take by mouth.   ipratropium 0.06 % nasal spray Commonly known as: ATROVENT  Place 2 sprays into both nostrils 4 (four) times daily.   ketotifen 0.025 % ophthalmic solution Commonly known as: ZADITOR as needed.   levocetirizine 5 MG tablet Commonly known as: XYZAL  Take 5 mg by mouth every evening.   Magic Mushroom Mix Caps Take by mouth.   multivitamin capsule Take 1 capsule by mouth daily.   Norethindrone  Acetate-Ethinyl Estradiol 1.5-30 MG-MCG tablet Commonly known as: LOESTRIN Take  1 tablet by mouth daily.        All past medical history, surgical history, allergies, family history, immunizations andmedications were updated in the EMR today and reviewed under the history and medication portions of their EMR.     Review of Systems  Constitutional:  Negative for chills, fever and malaise/fatigue.  HENT:  Positive for congestion, sinus pain and sore throat. Negative for ear discharge and ear pain.   Respiratory:  Negative for cough, shortness of breath and wheezing.   Gastrointestinal:  Negative for diarrhea, nausea and vomiting.  Skin:  Negative for rash.  Neurological:  Positive for headaches. Negative for dizziness.   Negative, with the exception of above mentioned in HPI   Objective:  Wt 192 lb (87.1 kg)   LMP 11/21/2023   BMI 34.56 kg/m  Body mass index is 34.56 kg/m. Physical Exam Vitals and nursing note reviewed.  Constitutional:      General: She is not in acute distress.    Appearance: Normal appearance. She is not toxic-appearing.     Comments: Loss of voice  HENT:     Head: Normocephalic and atraumatic.  Eyes:     General: No scleral icterus.       Right eye: No discharge.        Left eye: No discharge.     Conjunctiva/sclera: Conjunctivae normal.  Pulmonary:     Effort: Pulmonary effort is normal.  Musculoskeletal:     Cervical back: Normal range of motion.  Skin:    Findings: No rash.  Neurological:     Mental Status: She is alert and oriented to person, place, and time. Mental status is at baseline.  Psychiatric:        Mood and Affect: Mood normal.        Behavior: Behavior normal.        Thought Content: Thought content normal.        Judgment: Judgment normal.    No results found. No results found. No results found for this or any previous visit (from the past 24 hours).  Assessment/Plan: Sheila Nunez is a 48 y.o. female present for OV for  Laryngitis (Primary)/Bacterial sinusitis We discussed this is likely a viral  illness causing the laryngitis and sinus symptoms.  Antibiotics would not be helpful at this time. Rest, hydrate.  Continue flonase , mucinex (DM if cough), nettie pot or nasal saline.  Throat lozenge can be helpful Doxycycline  twice daily-patient will only start if symptoms rebound or worsen after 1 week of illness.  Patient reports understanding of instructions. F/U 2 weeks if not improved.   Reviewed expectations re:  course of current medical issues. Discussed self-management of symptoms. Outlined signs and symptoms indicating need for more acute intervention. Patient verbalized understanding and all questions were answered. Patient received an After-Visit Summary.    No orders of the defined types were placed in this encounter.  Meds ordered this encounter  Medications   doxycycline  (VIBRA -TABS) 100 MG tablet    Sig: Take 1 tablet (100 mg total) by mouth 2 (two) times daily.    Dispense:  20 tablet    Refill:  0   Referral Orders  No referral(s) requested today     Note is dictated utilizing voice recognition software. Although note has been proof read prior to signing, occasional typographical errors still can be missed. If any questions arise, please do not hesitate to call for verification.   electronically signed by:  Charlies Bellini, DO  [facility] [place]

## 2023-11-30 NOTE — Patient Instructions (Addendum)

## 2023-12-19 ENCOUNTER — Encounter: Payer: Self-pay | Admitting: Family Medicine

## 2023-12-19 ENCOUNTER — Ambulatory Visit: Payer: BC Managed Care – PPO | Admitting: Family Medicine

## 2023-12-19 VITALS — BP 108/72 | HR 85 | Temp 97.9°F | Wt 190.7 lb

## 2023-12-19 DIAGNOSIS — J029 Acute pharyngitis, unspecified: Secondary | ICD-10-CM

## 2023-12-19 DIAGNOSIS — J020 Streptococcal pharyngitis: Secondary | ICD-10-CM | POA: Diagnosis not present

## 2023-12-19 DIAGNOSIS — J039 Acute tonsillitis, unspecified: Secondary | ICD-10-CM

## 2023-12-19 LAB — POCT RAPID STREP A (OFFICE): Rapid Strep A Screen: POSITIVE — AB

## 2023-12-19 MED ORDER — METHYLPREDNISOLONE ACETATE 80 MG/ML IJ SUSP
80.0000 mg | Freq: Once | INTRAMUSCULAR | Status: AC
  Administered 2023-12-19: 80 mg via INTRAMUSCULAR

## 2023-12-19 MED ORDER — FLUCONAZOLE 150 MG PO TABS: 150.0000 mg | ORAL_TABLET | Freq: Once | ORAL | 0 refills | Status: AC

## 2023-12-19 MED ORDER — CEFDINIR 300 MG PO CAPS: 300.0000 mg | ORAL_CAPSULE | Freq: Two times a day (BID) | ORAL | 0 refills | Status: DC

## 2023-12-19 NOTE — Progress Notes (Signed)
Sheila Nunez , 11-17-1976, 48 y.o., female MRN: [id number] Patient Care Team    Relationship Specialty Notifications Start End  Natalia Leatherwood, DO PCP - General Family Medicine  07/19/15   Eileen Stanford, MD Referring Physician Allergy and Immunology  05/25/16   Andrena Mews, DO Consulting Physician Sports Medicine  05/06/18     Chief Complaint  Patient presents with   Rash    Last abx taken wed. Sore throat with rash across chest noticed on sunday     Subjective: Sheila Nunez is a 48 y.o. Pt presents for an OV with complaints of swollen throat of 2 days duration.  Associated symptoms include rash. Recently treated for upper respiratory infection with doxycycline twice daily.  She reports she is finished with that medication early last week.  It had resolved her symptoms.     07/31/2023    9:07 AM 07/27/2022    9:03 AM 09/23/2021    3:05 PM 08/04/2021   10:00 AM 07/20/2021   10:38 AM  Depression screen PHQ 2/9  Decreased Interest 0 0 0 0 0  Down, Depressed, Hopeless 0 0 0 0 0  PHQ - 2 Score 0 0 0 0 0    Allergies  Allergen Reactions   Zithromax [Azithromycin] Hives   Nickel Hives, Itching and Rash   Social History   Social History Narrative   Single. 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   Never smoker, occ etoh, no drugs.       Past Medical History:  Diagnosis Date   Allergy    Dr. Terri Piedra   Atopic dermatitis 04/01/2021   Chronic allergic conjunctivitis 04/01/2021   Depression    Eczema    Dr. 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 Nile Riggs   Family History  Problem Relation Age of Onset   GER disease Brother    Cancer Neg Hx    Allergies as of 12/19/2023       Reactions   Zithromax [azithromycin] Hives    Nickel Hives, Itching, Rash        Medication List        Accurate as of December 19, 2023  3:16 PM. If you have any questions, ask your nurse or doctor.          STOP taking these medications    amoxicillin-clavulanate 875-125 MG tablet Commonly known as: AUGMENTIN Stopped by: Felix Pacini   doxycycline 100 MG tablet Commonly known as: VIBRA-TABS Stopped by: Felix Pacini   EQ FIBER POWDER PO Stopped by: Felix Pacini   HAIR/SKIN/NAILS PO Stopped by: Felix Pacini   Magic Mushroom Mix Caps Stopped by: Felix Pacini       TAKE these medications    Azelastine HCl 137 MCG/SPRAY Soln Place 1 spray into both nostrils 2 (two) times daily as needed.   cefdinir 300 MG capsule Commonly known as: OMNICEF Take 1 capsule (300 mg total) by mouth 2 (two) times daily. Started by: Felix Pacini   cholecalciferol 1000 units tablet Commonly known as: VITAMIN D Take 2,000 Units by mouth daily.   fluconazole 150 MG tablet Commonly known as: DIFLUCAN Take 1 tablet (150 mg total) by mouth  once for 1 dose. Started by: Felix Pacini   fluocinonide ointment 0.05 % Commonly known as: LIDEX Apply topically as needed.   fluticasone 50 MCG/ACT nasal spray Commonly known as: FLONASE as needed.   ipratropium 0.06 % nasal spray Commonly known as: ATROVENT Place 2 sprays into both nostrils 4 (four) times daily.   ketotifen 0.025 % ophthalmic solution Commonly known as: ZADITOR as needed.   levocetirizine 5 MG tablet Commonly known as: XYZAL Take 5 mg by mouth every evening.   multivitamin capsule Take 1 capsule by mouth daily.   Norethindrone Acetate-Ethinyl Estradiol 1.5-30 MG-MCG tablet Commonly known as: LOESTRIN Take 1 tablet by mouth daily.        All past medical history, surgical history, allergies, family history, immunizations andmedications were updated in the EMR today and reviewed under the history and medication portions of their EMR.     Review of  Systems  Constitutional:  Positive for chills and malaise/fatigue. Negative for fever.  HENT:  Positive for sore throat. Negative for congestion, ear pain and sinus pain.   Eyes:  Negative for pain.  Respiratory:  Negative for cough, shortness of breath and wheezing.   Gastrointestinal:  Negative for diarrhea, nausea and vomiting.  Musculoskeletal:  Negative for myalgias.  Skin:  Positive for rash.  Neurological:  Negative for dizziness and headaches.   Negative, with the exception of above mentioned in HPI   Objective:  BP 108/72   Pulse 85   Temp 97.9 F (36.6 C)   Wt 190 lb 11.2 oz (86.5 kg)   LMP 11/21/2023   SpO2 98%   BMI 34.32 kg/m  Body mass index is 34.32 kg/m. Physical Exam Vitals and nursing note reviewed.  Constitutional:      General: She is not in acute distress.    Appearance: Normal appearance. She is not ill-appearing, toxic-appearing or diaphoretic.  HENT:     Head: Normocephalic and atraumatic.     Right Ear: Tympanic membrane, ear canal and external ear normal. There is no impacted cerumen.     Left Ear: Tympanic membrane, ear canal and external ear normal. There is no impacted cerumen.     Nose: No congestion or rhinorrhea.     Mouth/Throat:     Pharynx: Oropharyngeal exudate and posterior oropharyngeal erythema present.  Eyes:     General: No scleral icterus.       Right eye: No discharge.        Left eye: No discharge.     Extraocular Movements: Extraocular movements intact.     Conjunctiva/sclera: Conjunctivae normal.     Pupils: Pupils are equal, round, and reactive to light.  Cardiovascular:     Rate and Rhythm: Normal rate and regular rhythm.  Pulmonary:     Effort: Pulmonary effort is normal. No respiratory distress.     Breath sounds: Normal breath sounds. No wheezing, rhonchi or rales.  Musculoskeletal:     Right lower leg: No edema.     Left lower leg: No edema.  Lymphadenopathy:     Cervical: Cervical adenopathy present.  Skin:     General: Skin is warm.     Findings: Rash (fine raised rash chest) present.  Neurological:     Mental Status: She is alert and oriented to person, place, and time. Mental status is at baseline.     Motor: No weakness.     Gait: Gait normal.  Psychiatric:        Mood and Affect: Mood normal.  Behavior: Behavior normal.        Thought Content: Thought content normal.        Judgment: Judgment normal.      No results found. No results found. Results for orders placed or performed in visit on 12/19/23 (from the past 24 hours)  POCT rapid strep A     Status: Abnormal   Collection Time: 12/19/23  2:37 PM  Result Value Ref Range   Rapid Strep A Screen Positive (A) Negative    Assessment/Plan: Sheila Nunez is a 48 y.o. female present for OV for  Strep pharyngitis/tonsillitis: Positive strep test today Rest, hydrate.  Omnicef BID 10d prescribed, take until completed.  IM depo medrol 80 inj today If cough present it can last up to 6-8 weeks. Diflucan prescribed in the event of yeast infection development F/U 2 weeks if not improved.   Reviewed expectations re: course of current medical issues. Discussed self-management of symptoms. Outlined signs and symptoms indicating need for more acute intervention. Patient verbalized understanding and all questions were answered. Patient received an After-Visit Summary.    Orders Placed This Encounter  Procedures   POCT rapid strep A   Meds ordered this encounter  Medications   cefdinir (OMNICEF) 300 MG capsule    Sig: Take 1 capsule (300 mg total) by mouth 2 (two) times daily.    Dispense:  20 capsule    Refill:  0   fluconazole (DIFLUCAN) 150 MG tablet    Sig: Take 1 tablet (150 mg total) by mouth once for 1 dose.    Dispense:  1 tablet    Refill:  0   Referral Orders  No referral(s) requested today     Note is dictated utilizing voice recognition software. Although note has been proof read prior to signing,  occasional typographical errors still can be missed. If any questions arise, please do not hesitate to call for verification.   electronically signed by:  Felix Pacini, DO  [facility] [place]

## 2023-12-19 NOTE — Addendum Note (Signed)
Addended by: Filomena Jungling on: 12/19/2023 03:25 PM   Modules accepted: Orders

## 2023-12-19 NOTE — Patient Instructions (Addendum)
Return in about 2 weeks (around 01/02/2024), or if symptoms worsen or fail to improve.        Great to see you today.  I have refilled the medication(s) we provide.   If labs were collected or images ordered, we will inform you of  results once we have received them and reviewed. We will contact you either by echart message, or telephone call.  Please give ample time to the testing facility, and our office to run,  receive and review results. Please do not call inquiring of results, even if you can see them in your chart. We will contact you as soon as we are able. If it has been over 1 week since the test was completed, and you have not yet heard from [place], then please call us.    - echart message- for normal results that have been seen by the patient already.   - telephone call: abnormal results or if patient has not viewed results in their echart.  If a referral to a specialist was entered for you, please call us in 2 weeks if you have not heard from the specialist office to schedule.

## 2024-04-21 ENCOUNTER — Ambulatory Visit: Admitting: Family Medicine

## 2024-04-21 ENCOUNTER — Encounter: Payer: Self-pay | Admitting: Family Medicine

## 2024-04-21 VITALS — BP 108/72 | HR 76 | Temp 97.8°F | Wt 191.0 lb

## 2024-04-21 DIAGNOSIS — R232 Flushing: Secondary | ICD-10-CM | POA: Diagnosis not present

## 2024-04-21 DIAGNOSIS — R635 Abnormal weight gain: Secondary | ICD-10-CM | POA: Insufficient documentation

## 2024-04-21 DIAGNOSIS — R4189 Other symptoms and signs involving cognitive functions and awareness: Secondary | ICD-10-CM | POA: Diagnosis not present

## 2024-04-21 DIAGNOSIS — L679 Hair color and hair shaft abnormality, unspecified: Secondary | ICD-10-CM | POA: Insufficient documentation

## 2024-04-21 LAB — B12 AND FOLATE PANEL
Folate: 14.5 ng/mL (ref >5.9)
Vitamin B-12: 321 pg/mL (ref 211–911)

## 2024-04-21 LAB — IBC + FERRITIN
Ferritin: 31 ng/mL (ref 10.0–291.0)
Iron: 77 ug/dL (ref 42–145)
Saturation Ratios: 20.2 % (ref 20.0–50.0)
TIBC: 380.8 ug/dL (ref 250.0–450.0)
Transferrin: 272 mg/dL (ref 212.0–360.0)

## 2024-04-21 LAB — VITAMIN D 25 HYDROXY (VIT D DEFICIENCY, FRACTURES): VITD: 25.4 ng/mL — ABNORMAL LOW (ref 30.00–100.00)

## 2024-04-21 LAB — TSH: TSH: 1.56 u[IU]/mL (ref 0.35–5.50)

## 2024-04-21 NOTE — Patient Instructions (Addendum)

## 2024-04-21 NOTE — Progress Notes (Signed)
 Sheila Nunez , 12-18-1975, 48 y.o., female MRN: [id number] Patient Care Team    Relationship Specialty Notifications Start End  Mariel Shope, DO PCP - General Family Medicine  07/19/15   Anselmo Kings, MD Referring Physician Allergy  and Immunology  05/25/16   Clyde Darling, DO Consulting Physician Sports Medicine  05/06/18     Chief Complaint  Patient presents with   Hot Flashes    A few months; also c/o lack of hair/nail growth. LMP: 5/19.     Subjective: Sheila Nunez is a 48 y.o. Pt presents for an OV with complaints of decrease growth in hair and nails, gain of weight, hot flashes of 4 months duration.  Associated symptoms include irritability (when hot). Pt is uncertain when her mother starting experiencing perimenopause. Patient has been on bcp for 2 yrs for menorrhagia. She states her cycles now are very light. Last about 3-4 days, and she only notices it on when wiping now.  She also endorses brain fog.      07/31/2023    9:07 AM 07/27/2022    9:03 AM 09/23/2021    3:05 PM 08/04/2021   10:00 AM 07/20/2021   10:38 AM  Depression screen PHQ 2/9  Decreased Interest 0 0 0 0 0  Down, Depressed, Hopeless 0 0 0 0 0  PHQ - 2 Score 0 0 0 0 0    Allergies  Allergen Reactions   Zithromax [Azithromycin] Hives   Nickel Hives, Itching and Rash   Social History   Social History Narrative   Single. 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   Never smoker, occ etoh, no drugs.       Past Medical History:  Diagnosis Date   Allergy     Dr. Fleurette Humbles   Atopic dermatitis 04/01/2021   Chronic allergic conjunctivitis 04/01/2021   Depression    Eczema    Dr. 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 Gennie Kicks   Family History   Problem Relation Age of Onset   GER disease Brother    Cancer Neg Hx    Allergies as of 04/21/2024       Reactions   Zithromax [azithromycin] Hives   Nickel Hives, Itching, Rash        Medication List        Accurate as of April 21, 2024  9:51 AM. If you have any questions, ask your nurse or doctor.          STOP taking these medications    cefdinir  300 MG capsule Commonly known as: OMNICEF  Stopped by: Marbin Olshefski   cholecalciferol 1000 units tablet Commonly known as: VITAMIN D  Stopped by: Napolean Backbone       TAKE these medications    Azelastine HCl 137 MCG/SPRAY Soln Place 1 spray into both nostrils 2 (two) times daily as needed.   fluocinonide  ointment 0.05 % Commonly known as: LIDEX  Apply topically as needed.   fluticasone  50 MCG/ACT nasal spray Commonly known as: FLONASE  as needed.   ipratropium 0.06 % nasal spray Commonly known as: ATROVENT  Place 2 sprays into both nostrils 4 (four) times daily.   ketotifen 0.025 % ophthalmic solution Commonly known as: ZADITOR  as needed.   levocetirizine 5 MG tablet Commonly known as: XYZAL  Take 5 mg by mouth every evening.   multivitamin capsule Take 1 capsule by mouth daily.   Norethindrone  Acetate-Ethinyl Estradiol 1.5-30 MG-MCG tablet Commonly known as: LOESTRIN Take 1 tablet by mouth daily.        All past medical history, surgical history, allergies, family history, immunizations andmedications were updated in the EMR today and reviewed under the history and medication portions of their EMR.     ROS Negative, with the exception of above mentioned in HPI   Objective:  BP 108/72   Pulse 76   Temp 97.8 F (36.6 C)   Wt 191 lb (86.6 kg)   LMP 04/07/2024   SpO2 99%   BMI 34.38 kg/m  Body mass index is 34.38 kg/m. Physical Exam Vitals and nursing note reviewed.  Constitutional:      General: She is not in acute distress.    Appearance: Normal appearance. She is not ill-appearing,  toxic-appearing or diaphoretic.  HENT:     Head: Normocephalic and atraumatic.  Eyes:     General: No scleral icterus.       Right eye: No discharge.        Left eye: No discharge.     Extraocular Movements: Extraocular movements intact.     Conjunctiva/sclera: Conjunctivae normal.     Pupils: Pupils are equal, round, and reactive to light.  Neck:     Comments: No thyromegaly Cardiovascular:     Rate and Rhythm: Normal rate and regular rhythm.  Pulmonary:     Effort: Pulmonary effort is normal. No respiratory distress.     Breath sounds: Normal breath sounds. No wheezing, rhonchi or rales.  Musculoskeletal:     Right lower leg: No edema.     Left lower leg: No edema.  Skin:    General: Skin is warm.     Findings: No rash.  Neurological:     Mental Status: She is alert and oriented to person, place, and time. Mental status is at baseline.     Motor: No weakness.     Gait: Gait normal.  Psychiatric:        Mood and Affect: Mood normal.        Behavior: Behavior normal.        Thought Content: Thought content normal.        Judgment: Judgment normal.      No results found. No results found. No results found for this or any previous visit (from the past 24 hours).  Assessment/Plan: Sheila Nunez is a 48 y.o. female present for OV for  Hot flashes (Primary)/Hair changes/Brain fog/Weight gain Perimeno sx vs endocrine vs vitamin d /o. She has a h/o of vit d def, currently not taking extra vit d. - FSH/LH> discussed this would not be a complete accurate reflection of hormones, since she is on BCP.  - TSH - Vitamin D  (25 hydroxy) - IBC + Ferritin - B12 and Folate Panel - consider progesterone withdrawal bleed vs US   Reviewed expectations re: course of current medical issues. Discussed self-management of symptoms. Outlined signs and symptoms indicating need for more acute intervention. Patient verbalized understanding and all questions were answered. Patient received an  After-Visit Summary.    Orders Placed This Encounter  Procedures   FSH/LH   TSH   Vitamin D  (25 hydroxy)   IBC + Ferritin   B12 and Folate Panel   No orders of the defined types  were placed in this encounter.  Referral Orders  No referral(s) requested today     Note is dictated utilizing voice recognition software. Although note has been proof read prior to signing, occasional typographical errors still can be missed. If any questions arise, please do not hesitate to call for verification.   electronically signed by:  Napolean Backbone, DO  [facility] [place]

## 2024-04-22 ENCOUNTER — Ambulatory Visit: Payer: Self-pay | Admitting: Family Medicine

## 2024-04-22 LAB — FSH/LH
FSH: 4.1 m[IU]/mL
LH: 5.1 m[IU]/mL

## 2024-08-04 ENCOUNTER — Encounter: Payer: Self-pay | Admitting: Family Medicine

## 2024-08-04 ENCOUNTER — Ambulatory Visit: Payer: BC Managed Care – PPO | Admitting: Family Medicine

## 2024-08-04 VITALS — BP 106/72 | HR 69 | Temp 98.0°F | Ht 62.2 in | Wt 203.8 lb

## 2024-08-04 DIAGNOSIS — Z23 Encounter for immunization: Secondary | ICD-10-CM | POA: Diagnosis not present

## 2024-08-04 DIAGNOSIS — Z0000 Encounter for general adult medical examination without abnormal findings: Secondary | ICD-10-CM | POA: Diagnosis not present

## 2024-08-04 DIAGNOSIS — E669 Obesity, unspecified: Secondary | ICD-10-CM

## 2024-08-04 DIAGNOSIS — E559 Vitamin D deficiency, unspecified: Secondary | ICD-10-CM | POA: Diagnosis not present

## 2024-08-04 DIAGNOSIS — E782 Mixed hyperlipidemia: Secondary | ICD-10-CM | POA: Diagnosis not present

## 2024-08-04 DIAGNOSIS — Z131 Encounter for screening for diabetes mellitus: Secondary | ICD-10-CM

## 2024-08-04 DIAGNOSIS — Z1231 Encounter for screening mammogram for malignant neoplasm of breast: Secondary | ICD-10-CM

## 2024-08-04 DIAGNOSIS — Z793 Long term (current) use of hormonal contraceptives: Secondary | ICD-10-CM | POA: Diagnosis not present

## 2024-08-04 DIAGNOSIS — Z3041 Encounter for surveillance of contraceptive pills: Secondary | ICD-10-CM

## 2024-08-04 DIAGNOSIS — L2084 Intrinsic (allergic) eczema: Secondary | ICD-10-CM

## 2024-08-04 LAB — CBC
HCT: 43.5 % (ref 36.0–46.0)
Hemoglobin: 14.6 g/dL (ref 12.0–15.0)
MCHC: 33.5 g/dL (ref 30.0–36.0)
MCV: 88.3 fl (ref 78.0–100.0)
Platelets: 177 K/uL (ref 150.0–400.0)
RBC: 4.93 Mil/uL (ref 3.87–5.11)
RDW: 12.8 % (ref 11.5–15.5)
WBC: 5.4 K/uL (ref 4.0–10.5)

## 2024-08-04 LAB — COMPREHENSIVE METABOLIC PANEL WITH GFR
ALT: 19 U/L (ref 0–35)
AST: 15 U/L (ref 0–37)
Albumin: 4.2 g/dL (ref 3.5–5.2)
Alkaline Phosphatase: 62 U/L (ref 39–117)
BUN: 12 mg/dL (ref 6–23)
CO2: 29 meq/L (ref 19–32)
Calcium: 9.1 mg/dL (ref 8.4–10.5)
Chloride: 102 meq/L (ref 96–112)
Creatinine, Ser: 0.77 mg/dL (ref 0.40–1.20)
GFR: 91.64 mL/min (ref >60.00)
Glucose, Bld: 82 mg/dL (ref 70–99)
Potassium: 4.1 meq/L (ref 3.5–5.1)
Sodium: 138 meq/L (ref 135–145)
Total Bilirubin: 0.6 mg/dL (ref 0.2–1.2)
Total Protein: 7.2 g/dL (ref 6.0–8.3)

## 2024-08-04 LAB — LIPID PANEL
Cholesterol: 213 mg/dL — ABNORMAL HIGH (ref 0–200)
HDL: 53.9 mg/dL (ref >39.00)
LDL Cholesterol: 144 mg/dL — ABNORMAL HIGH (ref 0–99)
NonHDL: 159.18
Total CHOL/HDL Ratio: 4
Triglycerides: 74 mg/dL (ref 0.0–149.0)
VLDL: 14.8 mg/dL (ref 0.0–40.0)

## 2024-08-04 LAB — TSH: TSH: 1.19 u[IU]/mL (ref 0.35–5.50)

## 2024-08-04 LAB — HEMOGLOBIN A1C: Hgb A1c MFr Bld: 5.8 % (ref 4.6–6.5)

## 2024-08-04 LAB — VITAMIN D 25 HYDROXY (VIT D DEFICIENCY, FRACTURES): VITD: 37.02 ng/mL (ref 30.00–100.00)

## 2024-08-04 MED ORDER — FLUOCINONIDE 0.05 % EX OINT: TOPICAL_OINTMENT | CUTANEOUS | 11 refills | Status: AC | PRN

## 2024-08-04 MED ORDER — NORETHINDRONE ACET-ETHINYL EST 1.5-30 MG-MCG PO TABS: 1.0000 | ORAL_TABLET | Freq: Every day | ORAL | 3 refills | Status: AC

## 2024-08-04 NOTE — Progress Notes (Signed)
 Patient ID: Sheila Nunez, female  DOB: January 31, 1976, 48 y.o.   MRN: [id number] Patient Care Team    Relationship Specialty Notifications Start End  Catherine Charlies LABOR, DO PCP - General Family Medicine  07/19/15   Cheryn Nickels, MD Referring Physician Allergy  and Immunology  05/25/16   Marquette Ozell BIRCH, DO Consulting Physician Sports Medicine  05/06/18     Chief Complaint  Patient presents with   Annual Exam    Chronic Conditions/illness Management.  Pt is fasting.      Subjective:  Sheila Nunez is a 48 y.o.  Female  present for CPE and Chronic Conditions/illness Management All past medical history, surgical history, allergies, family history, immunizations, medications and social history were updated in the electronic medical record today. All recent labs, ED visits and hospitalizations within the last year were reviewed.  Health maintenance:  Colonoscopy: No Fhx . cologuard 01/11/2022-normal Mammogram: No fhx.  Completed 10/24/2023-[facility]-[place] Cervical cancer screening: 07/21/2021-negative with negative HPV-PCP Immunizations: tdap due-administered influenza originally reported allergic, but has had last couple years without incident -declined. Infectious disease screening: HIV and hep c  completed  DEXA: routine screen Patient has a Dental home. Hospitalizations/ED visits: Reviewed  BCP restarted 04/2022 after many years off birth control.  She is currently in a monogamous relationship with a female partner.  She does not desire further pregnancies.Patient's last menstrual period was Patient's last menstrual period was 07/29/2024. Her menstrual cycles are approximately every 22 days, and last 3 days.  She reports they are not heavy. Pt has possible h/o adenomyosis per US > she had increased cramping around menses, but menses was not heavier at that time.  Mammograms are UTD.  She has never smoked. She is currently 45. No personal or fhx of blood clot d/o.     08/04/2024    8:41  AM 07/31/2023    9:07 AM 07/27/2022    9:03 AM 09/23/2021    3:05 PM 08/04/2021   10:00 AM  Depression screen PHQ 2/9  Decreased Interest 0 0 0 0 0  Down, Depressed, Hopeless 0 0 0 0 0  PHQ - 2 Score 0 0 0 0 0  Altered sleeping 0      Tired, decreased energy 0      Change in appetite 0      Feeling bad or failure about yourself  0      Trouble concentrating 0      Moving slowly or fidgety/restless 0      Suicidal thoughts 0      PHQ-9 Score 0      Difficult doing work/chores Not difficult at all          08/04/2024    8:41 AM  GAD 7 : Generalized Anxiety Score  Nervous, Anxious, on Edge 0  Control/stop worrying 0  Worry too much - different things 0  Trouble relaxing 0  Restless 0  Easily annoyed or irritable 0  Afraid - awful might happen 0  Total GAD 7 Score 0  Anxiety Difficulty Not difficult at all    Immunization History  Administered Date(s) Administered   INFLUENZA, HIGH DOSE SEASONAL PF 01/06/2021   Influenza-Unspecified 09/20/2018   Tdap 07/14/2014   Past Medical History:  Diagnosis Date   Allergy     Dr. Ivin   Atopic dermatitis 04/01/2021   Chronic allergic conjunctivitis 04/01/2021   Depression    Eczema    Dr. 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 Roz   Family History  Problem Relation Age of Onset   GER disease Brother    Cancer Neg Hx    Social History   Social History Narrative   Single. 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   Never smoker, occ etoh, no drugs.        Allergies as of 08/04/2024       Reactions   Zithromax [azithromycin] Hives   Nickel Hives, Itching, Rash         Medication List        Accurate as of August 04, 2024  8:58 AM. If you have any questions, ask your nurse or doctor.          Azelastine HCl 137 MCG/SPRAY Soln Place 1 spray into both nostrils 2 (two) times daily as needed.   fluocinonide  ointment 0.05 % Commonly known as: LIDEX  Apply topically as needed.   fluticasone  50 MCG/ACT nasal spray Commonly known as: FLONASE  as needed.   ipratropium 0.06 % nasal spray Commonly known as: ATROVENT  Place 2 sprays into both nostrils 4 (four) times daily.   ketotifen 0.025 % ophthalmic solution Commonly known as: ZADITOR as needed.   levocetirizine 5 MG tablet Commonly known as: XYZAL  Take 5 mg by mouth every evening.   multivitamin capsule Take 1 capsule by mouth daily.   Norethindrone  Acetate-Ethinyl Estradiol 1.5-30 MG-MCG tablet Commonly known as: LOESTRIN Take 1 tablet by mouth daily.        All past medical history, surgical history, allergies, family history, immunizations andmedications were updated in the EMR today and reviewed under the history and medication portions of their EMR.     No results found for this or any previous visit (from the past 2160 hours).   ROS 14 pt review of systems performed and negative (unless mentioned in an HPI) Objective: BP 106/72   Pulse 69   Temp 98 F (36.7 C)   Ht 5' 2.2 (1.58 m)   Wt 203 lb 12.8 oz (92.4 kg)   LMP 07/29/2024   SpO2 97%   BMI 37.04 kg/m  Physical Exam Vitals and nursing note reviewed.  Constitutional:      General: She is not in acute distress.    Appearance: Normal appearance. She is not ill-appearing or toxic-appearing.  HENT:     Head: Normocephalic and atraumatic.     Right Ear: Tympanic membrane, ear canal and external ear normal. There is no impacted cerumen.     Left Ear: Tympanic membrane, ear canal and external ear normal. There is no impacted cerumen.     Nose: No congestion or rhinorrhea.     Mouth/Throat:     Mouth: Mucous  membranes are moist.     Pharynx: Oropharynx is clear. No oropharyngeal exudate or posterior oropharyngeal erythema.  Eyes:     General: No scleral icterus.       Right eye: No discharge.        Left eye: No discharge.     Extraocular Movements: Extraocular movements intact.     Conjunctiva/sclera: Conjunctivae normal.     Pupils: Pupils are equal, round, and reactive  to light.  Cardiovascular:     Rate and Rhythm: Normal rate and regular rhythm.     Pulses: Normal pulses.     Heart sounds: Normal heart sounds. No murmur heard.    No friction rub. No gallop.  Pulmonary:     Effort: Pulmonary effort is normal. No respiratory distress.     Breath sounds: Normal breath sounds. No stridor. No wheezing, rhonchi or rales.  Chest:     Chest wall: No tenderness.  Abdominal:     General: Abdomen is flat. Bowel sounds are normal. There is no distension.     Palpations: Abdomen is soft. There is no mass.     Tenderness: There is no abdominal tenderness. There is no right CVA tenderness, left CVA tenderness, guarding or rebound.     Hernia: No hernia is present.  Musculoskeletal:        General: No swelling, tenderness or deformity. Normal range of motion.     Cervical back: Normal range of motion and neck supple. No rigidity or tenderness.     Right lower leg: No edema.     Left lower leg: No edema.  Lymphadenopathy:     Cervical: No cervical adenopathy.  Skin:    General: Skin is warm and dry.     Coloration: Skin is not jaundiced or pale.     Findings: No bruising, erythema, lesion or rash.  Neurological:     General: No focal deficit present.     Mental Status: She is alert and oriented to person, place, and time. Mental status is at baseline.     Cranial Nerves: No cranial nerve deficit.     Sensory: No sensory deficit.     Motor: No weakness.     Coordination: Coordination normal.     Gait: Gait normal.     Deep Tendon Reflexes: Reflexes normal.  Psychiatric:        Mood and  Affect: Mood normal.        Behavior: Behavior normal.        Thought Content: Thought content normal.        Judgment: Judgment normal.      No results found.  Assessment/plan: KEZIA BENEVIDES is a 48 y.o. female present for CPE and Chronic Conditions/illness Management Vitamin D  deficiency Continue vit d supplement  Mixed hyperlipidemia/ (BMI 30-39.9) Continue dietary modifications, exercise and fiber.  Lipids collected today Breast cancer screening by mammogram Ordered for 10/2024 Intrinsic eczema Lidex  refilled for her has needed  Routine general medical examination at a health care facility Colonoscopy: No Fhx . cologuard 01/11/2022-normal Mammogram: No fhx.  Completed 10/24/2023-breast center-[place] Cervical cancer screening: 07/21/2021-negative with negative HPV-PCP Immunizations: tdap due-administered influenza originally reported allergic, but has had last couple years without incident -declined. Infectious disease screening: HIV and hep c  completed  DEXA: routine screen Patient was encouraged to exercise greater than 150 minutes a week. Patient was encouraged to choose a diet filled with fresh fruits and vegetables, and lean meats. AVS provided to patient today for education/recommendation on gender specific health and safety maintenance.   Return in about 1 year (around 08/05/2025) for cpe (20 min), Routine chronic condition follow-up.   Orders Placed This Encounter  Procedures   Tdap vaccine greater than or equal to 7yo IM   CBC   Comprehensive metabolic panel with GFR   Hemoglobin A1c   Lipid panel   TSH   Vitamin D  (25 hydroxy)   Meds ordered this encounter  Medications   Norethindrone  Acetate-Ethinyl Estradiol (LOESTRIN) 1.5-30 MG-MCG tablet    Sig: Take 1 tablet by mouth daily.    Dispense:  90 tablet    Refill:  3   fluocinonide  ointment (LIDEX ) 0.05 %    Sig: Apply topically as needed.    Dispense:  30 g    Refill:  11   Referral Orders  No  referral(s) requested today     Electronically signed by: Charlies Bellini, DO [facility] [place]

## 2024-08-04 NOTE — Patient Instructions (Addendum)

## 2024-08-05 ENCOUNTER — Ambulatory Visit: Payer: Self-pay | Admitting: Family Medicine

## 2024-09-01 ENCOUNTER — Other Ambulatory Visit: Payer: Self-pay | Admitting: Family Medicine

## 2024-09-01 DIAGNOSIS — Z1231 Encounter for screening mammogram for malignant neoplasm of breast: Secondary | ICD-10-CM

## 2024-10-02 ENCOUNTER — Ambulatory Visit: Admitting: Family Medicine

## 2024-10-02 VITALS — BP 108/70 | HR 74 | Temp 97.9°F | Wt 203.8 lb

## 2024-10-02 DIAGNOSIS — H00012 Hordeolum externum right lower eyelid: Secondary | ICD-10-CM

## 2024-10-02 MED ORDER — BACITRACIN-POLYMYXIN B 500-10000 UNIT/GM OP OINT: 1.0000 | TOPICAL_OINTMENT | Freq: Every evening | OPHTHALMIC | 0 refills | Status: AC | PRN

## 2024-10-02 NOTE — Progress Notes (Signed)
 Sheila Nunez , 11-Dec-1975, 48 y.o., female MRN: [id number] Patient Care Team    Relationship Specialty Notifications Start End  Catherine Charlies LABOR, DO PCP - General Family Medicine  07/19/15   Cheryn Nickels, MD Referring Physician Allergy  and Immunology  05/25/16   Marquette Ozell BIRCH, DO Consulting Physician Sports Medicine  05/06/18     Chief Complaint  Patient presents with   Facial Swelling    2 weeks; R eye. Tender to the touch.      Subjective: Sheila Nunez is a 48 y.o. Pt presents for an OV with complaints of right eye swelling of 2 weeks duration.  Associated symptoms include tenderness. Pt has tried sinus effleurage to ease their symptoms.  She reports she did not notice a stye but has had styes in the past. She denies any fever, chills, respiratory symptoms or drainage/discharge from eye. Patient denies visual changes.     08/04/2024    8:41 AM 07/31/2023    9:07 AM 07/27/2022    9:03 AM 09/23/2021    3:05 PM 08/04/2021   10:00 AM  Depression screen PHQ 2/9  Decreased Interest 0 0 0 0 0  Down, Depressed, Hopeless 0 0 0 0 0  PHQ - 2 Score 0 0 0 0 0  Altered sleeping 0      Tired, decreased energy 0      Change in appetite 0      Feeling bad or failure about yourself  0      Trouble concentrating 0      Moving slowly or fidgety/restless 0      Suicidal thoughts 0      PHQ-9 Score 0       Difficult doing work/chores Not difficult at all         Data saved with a previous flowsheet row definition    Allergies  Allergen Reactions   Zithromax [Azithromycin] Hives   Nickel Hives, Itching and Rash   Social History   Social History Narrative   Single. 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   Never smoker, occ etoh, no drugs.       Past Medical History:  Diagnosis Date   Allergy     Dr. Ivin   Atopic dermatitis 04/01/2021   Chronic allergic conjunctivitis 04/01/2021    Depression    Eczema    Dr. 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 Roz   Family History  Problem Relation Age of Onset   GER disease Brother    Cancer Neg Hx    Allergies as of 10/02/2024       Reactions   Zithromax [azithromycin] Hives   Nickel Hives, Itching, Rash        Medication List        Accurate as of October 02, 2024 11:11 AM. If you have any questions, ask your nurse or doctor.          Azelastine HCl 137 MCG/SPRAY Soln Place 1 spray into both nostrils 2 (two) times daily as needed.   bacitracin-polymyxin b ophthalmic ointment Commonly known as: POLYSPORIN Place 1 Application into the right eye at bedtime as needed for up to 7 days.  Started by: Shevawn Langenberg   fluocinonide  ointment 0.05 % Commonly known as: LIDEX  Apply topically as needed.   fluticasone  50 MCG/ACT nasal spray Commonly known as: FLONASE  as needed.   ipratropium 0.06 % nasal spray Commonly known as: ATROVENT  Place 2 sprays into both nostrils 4 (four) times daily.   ketotifen 0.025 % ophthalmic solution Commonly known as: ZADITOR as needed.   levocetirizine 5 MG tablet Commonly known as: XYZAL  Take 5 mg by mouth every evening.   multivitamin capsule Take 1 capsule by mouth daily.   Norethindrone  Acetate-Ethinyl Estradiol 1.5-30 MG-MCG tablet Commonly known as: LOESTRIN Take 1 tablet by mouth daily.        All past medical history, surgical history, allergies, family history, immunizations andmedications were updated in the EMR today and reviewed under the history and medication portions of their EMR.     Review of Systems  All other systems reviewed and are negative.  Negative, with the exception of above mentioned in HPI   Objective:  BP 108/70   Pulse 74   Temp 97.9 F (36.6 C)   Wt 203 lb 12.8  oz (92.4 kg)   SpO2 98%   BMI 37.04 kg/m  Body mass index is 37.04 kg/m.  Physical Exam Vitals and nursing note reviewed.  Constitutional:      General: She is not in acute distress.    Appearance: Normal appearance. She is normal weight. She is not ill-appearing or toxic-appearing.  HENT:     Head: Normocephalic and atraumatic.  Eyes:     General: Lids are normal. Vision grossly intact. Gaze aligned appropriately. No allergic shiner, visual field deficit or scleral icterus.       Right eye: No discharge.        Left eye: No discharge.     Extraocular Movements: Extraocular movements intact.     Right eye: Normal extraocular motion.     Left eye: Normal extraocular motion.     Conjunctiva/sclera: Conjunctivae normal.     Right eye: Right conjunctiva is not injected. No exudate or hemorrhage.    Left eye: Left conjunctiva is not injected. No exudate or hemorrhage.    Pupils: Pupils are equal, round, and reactive to light.     Comments: Right mid lower eyelid: Small area of erythema inside lower eyelid.  Mild swelling without erythema noted visually.  Skin:    Findings: No rash.  Neurological:     Mental Status: She is alert and oriented to person, place, and time. Mental status is at baseline.     Motor: No weakness.     Coordination: Coordination normal.     Gait: Gait normal.  Psychiatric:        Mood and Affect: Mood normal.        Behavior: Behavior normal.        Thought Content: Thought content normal.        Judgment: Judgment normal.      No results found. No results found. No results found for this or any previous visit (from the past 24 hours).  Assessment/Plan: Sheila Nunez is a 48 y.o. female present for OV for  Hordeolum externum of right lower eyelid (Primary) Exam consistent with stye that is possibly resolving. Cortisporin ophthalmic ointment prescribed Follow-up as needed  Reviewed expectations re: course of current medical issues. Discussed  self-management of symptoms. Outlined signs and symptoms indicating need for more acute intervention. Patient verbalized understanding and all questions were answered. Patient received an  After-Visit Summary.   Return in about 2 weeks (around 10/16/2024), or if symptoms worsen or fail to improve.  No orders of the defined types were placed in this encounter.  Meds ordered this encounter  Medications   bacitracin-polymyxin b (POLYSPORIN) ophthalmic ointment    Sig: Place 1 Application into the right eye at bedtime as needed for up to 7 days.    Dispense:  3.5 g    Refill:  0   Referral Orders  No referral(s) requested today     Note is dictated utilizing voice recognition software. Although note has been proof read prior to signing, occasional typographical errors still can be missed. If any questions arise, please do not hesitate to call for verification.   electronically signed by:  Charlies Bellini, DO  [facility] [place]

## 2024-10-02 NOTE — Patient Instructions (Signed)

## 2024-10-10 ENCOUNTER — Encounter: Payer: Self-pay | Admitting: Family Medicine

## 2024-10-10 DIAGNOSIS — H5789 Other specified disorders of eye and adnexa: Secondary | ICD-10-CM

## 2024-10-13 NOTE — Telephone Encounter (Signed)
Referral placed to ophthalmology. ?

## 2024-10-28 ENCOUNTER — Ambulatory Visit
Admission: RE | Admit: 2024-10-28 | Discharge: 2024-10-28 | Disposition: A | Source: Ambulatory Visit | Attending: Family Medicine | Admitting: Family Medicine

## 2024-10-28 DIAGNOSIS — Z1231 Encounter for screening mammogram for malignant neoplasm of breast: Secondary | ICD-10-CM | POA: Diagnosis not present

## 2024-11-03 ENCOUNTER — Ambulatory Visit: Payer: Self-pay | Admitting: Family Medicine

## 2024-11-17 ENCOUNTER — Ambulatory Visit: Admitting: Family Medicine

## 2024-11-17 ENCOUNTER — Encounter: Payer: Self-pay | Admitting: Family Medicine

## 2024-11-17 VITALS — BP 106/74 | HR 79 | Temp 98.2°F | Wt 208.4 lb

## 2024-11-17 DIAGNOSIS — B9789 Other viral agents as the cause of diseases classified elsewhere: Secondary | ICD-10-CM

## 2024-11-17 DIAGNOSIS — J988 Other specified respiratory disorders: Secondary | ICD-10-CM | POA: Diagnosis not present

## 2024-11-17 DIAGNOSIS — H9209 Otalgia, unspecified ear: Secondary | ICD-10-CM | POA: Diagnosis not present

## 2024-11-17 DIAGNOSIS — R051 Acute cough: Secondary | ICD-10-CM | POA: Diagnosis not present

## 2024-11-17 LAB — POCT INFLUENZA A/B
Influenza A, POC: NEGATIVE
Influenza B, POC: NEGATIVE

## 2024-11-17 MED ORDER — METHYLPREDNISOLONE ACETATE 80 MG/ML IJ SUSP
80.0000 mg | Freq: Once | INTRAMUSCULAR | Status: AC
  Administered 2024-11-17: 80 mg via INTRAMUSCULAR

## 2024-11-17 NOTE — Patient Instructions (Signed)

## 2024-11-17 NOTE — Progress Notes (Signed)
 "      Sheila Nunez , [birth year], 48 y.o., female MRN: [id number] Patient Care Team    Relationship Specialty Notifications Start End  Sheila Nunez LABOR, DO PCP - General Family Medicine  07/19/15   Sheila Nickels, MD Referring Physician Allergy  and Immunology  05/25/16   Sheila Ozell BIRCH, DO Consulting Physician Sports Medicine  05/06/18     Chief Complaint  Patient presents with   Cough    2 days; cough, right ear pain, headache. Pt has not tried anything to relieve sx.      Subjective: Sheila Nunez is a 48 y.o. Pt presents for an OV with complaints of ear pain and cough.  Patient reports ear pain started about 3 or 4 days ago, cough started 2 days ago and headache started this morning.  She has been using her Flonase  nasal spray daily.  She has not been around any sick contacts that she is aware of, and your mask over the holiday season.  There were some children at her family gathering that had mild colds.  She denies any fevers, chills or rash.     08/04/2024    8:41 AM 07/31/2023    9:07 AM 07/27/2022    9:03 AM 09/23/2021    3:05 PM 08/04/2021   10:00 AM  Depression screen PHQ 2/9  Decreased Interest 0 0 0 0 0  Down, Depressed, Hopeless 0 0 0 0 0  PHQ - 2 Score 0 0 0 0 0  Altered sleeping 0      Tired, decreased energy 0      Change in appetite 0      Feeling bad or failure about yourself  0      Trouble concentrating 0      Moving slowly or fidgety/restless 0      Suicidal thoughts 0      PHQ-9 Score 0       Difficult doing work/chores Not difficult at all         Data saved with a previous flowsheet row definition    Allergies[1] Social History   Social History Narrative   Single. 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   Never smoker, occ etoh, no drugs.       Past Medical History:  Diagnosis Date   Allergy     Dr. Ivin   Atopic dermatitis 04/01/2021   Chronic allergic  conjunctivitis 04/01/2021   Depression    Eczema    Dr. 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 Roz   Family History  Problem Relation Age of Onset   Prostate cancer Father    Breast cancer Maternal Grandmother    GER disease Brother    Cancer Neg Hx    Allergies as of 11/17/2024       Reactions   Zithromax [azithromycin] Hives   Nickel Hives, Itching, Rash        Medication List        Accurate as of November 17, 2024 11:57 AM. If you have any questions, ask your nurse or doctor.          Azelastine HCl 137 MCG/SPRAY Soln Place 1 spray into both nostrils 2 (two) times  daily as needed.   fluocinonide  ointment 0.05 % Commonly known as: LIDEX  Apply topically as needed.   fluticasone  50 MCG/ACT nasal spray Commonly known as: FLONASE  as needed.   ipratropium 0.06 % nasal spray Commonly known as: ATROVENT  Place 2 sprays into both nostrils 4 (four) times daily.   ketotifen 0.025 % ophthalmic solution Commonly known as: ZADITOR as needed.   levocetirizine 5 MG tablet Commonly known as: XYZAL  Take 5 mg by mouth every evening.   multivitamin capsule Take 1 capsule by mouth daily.   Norethindrone  Acetate-Ethinyl Estradiol 1.5-30 MG-MCG tablet Commonly known as: LOESTRIN Take 1 tablet by mouth daily.        All past medical history, surgical history, allergies, family history, immunizations andmedications were updated in the EMR today and reviewed under the history and medication portions of their EMR.     Review of Systems  Constitutional:  Positive for malaise/fatigue. Negative for chills and fever.  HENT:  Positive for congestion and ear pain. Negative for sinus pain and sore throat.   Eyes: Negative.   Respiratory:  Positive for cough and sputum production. Negative for shortness of breath.    Cardiovascular: Negative.   Gastrointestinal:  Negative for abdominal pain, diarrhea, nausea and vomiting.  Genitourinary: Negative.   Musculoskeletal: Negative.   Neurological:  Positive for headaches.   Negative, with the exception of above mentioned in HPI   Objective:  BP 106/74   Pulse 79   Temp 98.2 F (36.8 C)   Wt 208 lb 6.4 oz (94.5 kg)   SpO2 98%   BMI 37.87 kg/m  Body mass index is 37.87 kg/m.  Physical Exam Vitals and nursing note reviewed.  Constitutional:      General: She is not in acute distress.    Appearance: Normal appearance. She is normal weight. She is not ill-appearing or toxic-appearing.  HENT:     Head: Normocephalic and atraumatic.     Right Ear: Ear canal and external ear normal. No tenderness. A middle ear effusion is present. Tympanic membrane is not injected, erythematous, retracted or bulging.     Left Ear: Ear canal and external ear normal. No tenderness. A middle ear effusion is present. Tympanic membrane is not injected, erythematous, retracted or bulging.     Nose: Congestion and rhinorrhea present.     Right Turbinates: Swollen.     Left Turbinates: Swollen.     Mouth/Throat:     Mouth: Mucous membranes are moist.     Pharynx: No oropharyngeal exudate or posterior oropharyngeal erythema.     Tonsils: 1+ on the right. 1+ on the left.  Eyes:     General: No scleral icterus.       Right eye: No discharge.        Left eye: No discharge.     Extraocular Movements: Extraocular movements intact.     Conjunctiva/sclera: Conjunctivae normal.     Pupils: Pupils are equal, round, and reactive to light.  Cardiovascular:     Rate and Rhythm: Normal rate and regular rhythm.     Heart sounds: No murmur heard. Pulmonary:     Effort: Pulmonary effort is normal. No respiratory distress.     Breath sounds: Normal breath sounds. No wheezing, rhonchi or rales.     Comments: Mild cough present Musculoskeletal:     Cervical back: Neck supple. No  tenderness.  Lymphadenopathy:     Cervical: Cervical adenopathy present.  Skin:    Findings: No rash.  Neurological:  Mental Status: She is alert and oriented to person, place, and time. Mental status is at baseline.     Motor: No weakness.     Coordination: Coordination normal.     Gait: Gait normal.  Psychiatric:        Mood and Affect: Mood normal.        Behavior: Behavior normal.        Thought Content: Thought content normal.        Judgment: Judgment normal.      No results found. No results found. Results for orders placed or performed in visit on 11/17/24 (from the past 24 hours)  POCT Influenza A/B     Status: Normal   Collection Time: 11/17/24 10:45 AM  Result Value Ref Range   Influenza A, POC Negative Negative   Influenza B, POC Negative Negative    Assessment/Plan: SERRINA MINOGUE is a 48 y.o. female present for OV for  Otalgia, unspecified laterality (Primary)/Acute cough/Viral respiratory illness Rest, hydrate.  flonase , mucinex (DM if cough), nasal saline.  IM depo medrol  80 F/u 2 weeks prn   Reviewed expectations re: course of current medical issues. Discussed self-management of symptoms. Outlined signs and symptoms indicating need for more acute intervention. Patient verbalized understanding and all questions were answered. Patient received an After-Visit Summary.    Orders Placed This Encounter  Procedures   POCT Influenza A/B   Meds ordered this encounter  Medications   methylPREDNISolone  acetate (DEPO-MEDROL ) injection 80 mg   Referral Orders  No referral(s) requested today     Note is dictated utilizing voice recognition software. Although note has been proof read prior to signing, occasional typographical errors still can be missed. If any questions arise, please do not hesitate to call for verification.   electronically signed by:  Nunez Bellini, DO  [facility] [place]       [1]  Allergies Allergen Reactions    Zithromax [Azithromycin] Hives   Nickel Hives, Itching and Rash   "

## 2025-08-10 ENCOUNTER — Encounter: Admitting: Family Medicine
